# Patient Record
Sex: Female | Born: 1990 | Race: Black or African American | Hispanic: No | Marital: Single | Smoking: Current some day smoker
Health system: Southern US, Community
[De-identification: ages and names within clinical notes are randomized; demographics above are authoritative.]

## PROBLEM LIST (undated history)

## (undated) DIAGNOSIS — B019 Varicella without complication: Secondary | ICD-10-CM

## (undated) DIAGNOSIS — F1910 Other psychoactive substance abuse, uncomplicated: Secondary | ICD-10-CM

## (undated) DIAGNOSIS — N83209 Unspecified ovarian cyst, unspecified side: Secondary | ICD-10-CM

## (undated) DIAGNOSIS — Z202 Contact with and (suspected) exposure to infections with a predominantly sexual mode of transmission: Secondary | ICD-10-CM

## (undated) DIAGNOSIS — R599 Enlarged lymph nodes, unspecified: Secondary | ICD-10-CM

## (undated) HISTORY — PX: GASTROSTOMY W/ FEEDING TUBE: SUR642

## (undated) HISTORY — DX: Other psychoactive substance abuse, uncomplicated: F19.10

## (undated) HISTORY — DX: Enlarged lymph nodes, unspecified: R59.9

## (undated) HISTORY — DX: Varicella without complication: B01.9

## (undated) HISTORY — DX: Contact with and (suspected) exposure to infections with a predominantly sexual mode of transmission: Z20.2

---
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
## 2011-08-17 ENCOUNTER — Emergency Department (HOSPITAL_COMMUNITY)
Admission: EM | Admit: 2011-08-17 | Discharge: 2011-08-18 | Disposition: A | Discharge status: Home / Self Care | Payer: Self-pay | Attending: Emergency Medicine | Admitting: Emergency Medicine

## 2011-08-17 ENCOUNTER — Encounter (HOSPITAL_COMMUNITY): Payer: Self-pay | Admitting: *Deleted

## 2011-08-17 DIAGNOSIS — N730 Acute parametritis and pelvic cellulitis: Secondary | ICD-10-CM | POA: Insufficient documentation

## 2011-08-17 DIAGNOSIS — N760 Acute vaginitis: Secondary | ICD-10-CM | POA: Insufficient documentation

## 2011-08-17 DIAGNOSIS — B9689 Other specified bacterial agents as the cause of diseases classified elsewhere: Secondary | ICD-10-CM | POA: Insufficient documentation

## 2011-08-17 DIAGNOSIS — A499 Bacterial infection, unspecified: Secondary | ICD-10-CM | POA: Insufficient documentation

## 2011-08-17 DIAGNOSIS — R109 Unspecified abdominal pain: Secondary | ICD-10-CM | POA: Insufficient documentation

## 2011-08-17 LAB — DIFFERENTIAL
Lymphs Abs: 2.8 10*3/uL (ref 0.7–4.0)
Monocytes Relative: 9 % (ref 3–12)
Neutro Abs: 3.9 10*3/uL (ref 1.7–7.7)
Neutrophils Relative %: 52 % (ref 43–77)

## 2011-08-17 LAB — URINALYSIS, ROUTINE W REFLEX MICROSCOPIC
Bilirubin Urine: NEGATIVE
Hgb urine dipstick: NEGATIVE
Ketones, ur: NEGATIVE mg/dL
Protein, ur: NEGATIVE mg/dL
Urobilinogen, UA: 1 mg/dL (ref 0.0–1.0)

## 2011-08-17 LAB — WET PREP, GENITAL

## 2011-08-17 LAB — CBC
Hemoglobin: 12.2 g/dL (ref 12.0–15.0)
RBC: 4.23 MIL/uL (ref 3.87–5.11)
WBC: 7.5 10*3/uL (ref 4.0–10.5)

## 2011-08-17 LAB — URINE MICROSCOPIC-ADD ON

## 2011-08-17 MED ORDER — AZITHROMYCIN 1 G PO PACK
1.0000 g | PACK | Freq: Once | ORAL | Status: AC
  Administered 2011-08-17: 1 g via ORAL
  Filled 2011-08-17: qty 1

## 2011-08-17 MED ORDER — LIDOCAINE HCL (PF) 1 % IJ SOLN
INTRAMUSCULAR | Status: AC
  Administered 2011-08-18: 2 mL
  Filled 2011-08-17: qty 5

## 2011-08-17 MED ORDER — METRONIDAZOLE 500 MG PO TABS
500.0000 mg | ORAL_TABLET | Freq: Once | ORAL | Status: AC
  Administered 2011-08-17: 500 mg via ORAL
  Filled 2011-08-17: qty 1

## 2011-08-17 MED ORDER — CEFTRIAXONE SODIUM 250 MG IJ SOLR
250.0000 mg | Freq: Once | INTRAMUSCULAR | Status: AC
  Administered 2011-08-17: 250 mg via INTRAMUSCULAR
  Filled 2011-08-17: qty 250

## 2011-08-17 NOTE — ED Notes (Signed)
Patient complaining of lower abdominal pain that started three days ago; patient complaining of abnormal yellow-tinged vaginal discharge.  Patient describes pain as "sore"; rates pain 6/10 on the numerical pain scale; pain is mostly lower quadrant abdominal pain, which radiates down into her pelvis and near anal area.  Denies any urinary changes.  Last menstrual period April 15th.  Patient alert and oriented x4; PERRL present.  No respiratory or acute distress noted.  Upon arrival to room, patient changed into gown.  Will continue to monitor.

## 2011-08-17 NOTE — ED Notes (Signed)
Assisted EDP with pelvic exam; lab now at bedside.

## 2011-08-17 NOTE — ED Notes (Signed)
Pelvic cart set up at bedside  

## 2011-08-17 NOTE — ED Provider Notes (Signed)
History     CSN: [id number]  Arrival date & time 08/17/11  2138   First MD Initiated Contact with Patient 08/17/11 2318      Chief Complaint  Patient presents with  . Abdominal Pain    (Consider location/radiation/quality/duration/timing/severity/associated sxs/prior treatment) Patient is a 21 y.o. female presenting with abdominal pain. The history is provided by the patient.  Abdominal Pain The primary symptoms of the illness include abdominal pain.  She has been having suprapubic pain for the last 3 days. Today, she's also had pain in the right suprapubic area. Pain does radiate through to the back. There is moderate in intensity and she rates it at 6/10. Nothing makes the pain better nothing makes it worse. She has not taken anything to try and help it. She denies fever, chills, sweats and denies nausea, vomiting, diarrhea. There has been a yellowish vaginal discharge present. Last menstrual period was April 12. She would've expected to have had a menstrual period in mid May but did not. She's not using any contraception.  History reviewed. No pertinent past medical history.  History reviewed. No pertinent past surgical history.  No family history on file.  History  Substance Use Topics  . Smoking status: Never Smoker   . Smokeless tobacco: Not on file  . Alcohol Use: No    OB History    Grav Para Term Preterm Abortions TAB SAB Ect Mult Living                  Review of Systems  Gastrointestinal: Positive for abdominal pain.  All other systems reviewed and are negative.    Allergies  Latex  Home Medications  No current outpatient prescriptions on file.  BP 124/75  Pulse 93  Temp(Src) 98.2 F (36.8 C) (Oral)  Resp 19  SpO2 100%  LMP 07/18/2011  Physical Exam  Nursing note and vitals reviewed.  21 year old female who is resting comfortably and in no acute distress. Vital signs are normal. Oxygen saturation is 100% which is normal. Head is normocephalic  and atraumatic. PERRLA, EOMI. Oropharynx is clear. Neck is nontender and supple. Back is nontender there's no CVA tenderness. Lungs are clear without rales, wheezes, rhonchi. Heart is regular rate and rhythm without murmur. Abdomen is soft, flat, with moderate suprapubic tenderness but no rebound or guarding. Peristalsis is present. Pelvic: Normal external female genitalia. Cervix is appears moderately inflamed with a mild yellowish discharge present. Specimens were sent for cultures. On bimanual examination, the fundus is normal size and position. There is mild to moderate adnexal tenderness bilaterally which is worse on the right. No adnexal masses are present. There is mild to moderate cervical motion tenderness. Extremities have full range of motion, no cyanosis or edema. Skin is warm and dry without rash. Neurologic: Mental status is normal, cranial nerves are intact, there no focal motor or sensory deficits.  ED Course  Procedures (including critical care time)  Results for orders placed during the hospital encounter of 08/17/11  URINALYSIS, ROUTINE W REFLEX MICROSCOPIC      Component Value Range   Color, Urine YELLOW  YELLOW    APPearance CLOUDY (*) CLEAR    Specific Gravity, Urine 1.029  1.005 - 1.030    pH 6.0  5.0 - 8.0    Glucose, UA NEGATIVE  NEGATIVE (mg/dL)   Hgb urine dipstick NEGATIVE  NEGATIVE    Bilirubin Urine NEGATIVE  NEGATIVE    Ketones, ur NEGATIVE  NEGATIVE (mg/dL)   Protein, ur NEGATIVE  NEGATIVE (mg/dL)   Urobilinogen, UA 1.0  0.0 - 1.0 (mg/dL)   Nitrite NEGATIVE  NEGATIVE    Leukocytes, UA MODERATE (*) NEGATIVE   PREGNANCY, URINE      Component Value Range   Preg Test, Ur NEGATIVE  NEGATIVE   URINE MICROSCOPIC-ADD ON      Component Value Range   Squamous Epithelial / LPF MANY (*) RARE    WBC, UA 3-6  <3 (WBC/hpf)   RBC / HPF 0-2  <3 (RBC/hpf)   Bacteria, UA FEW (*) RARE    Urine-Other MUCOUS PRESENT    CBC      Component Value Range   WBC 7.5  4.0 - 10.5  (K/uL)   RBC 4.23  3.87 - 5.11 (MIL/uL)   Hemoglobin 12.2  12.0 - 15.0 (g/dL)   HCT 16.1 (*) 09.6 - 46.0 (%)   MCV 84.4  78.0 - 100.0 (fL)   MCH 28.8  26.0 - 34.0 (pg)   MCHC 34.2  30.0 - 36.0 (g/dL)   RDW 04.5  40.9 - 81.1 (%)   Platelets 257  150 - 400 (K/uL)  DIFFERENTIAL      Component Value Range   Neutrophils Relative 52  43 - 77 (%)   Neutro Abs 3.9  1.7 - 7.7 (K/uL)   Lymphocytes Relative 38  12 - 46 (%)   Lymphs Abs 2.8  0.7 - 4.0 (K/uL)   Monocytes Relative 9  3 - 12 (%)   Monocytes Absolute 0.7  0.1 - 1.0 (K/uL)   Eosinophils Relative 1  0 - 5 (%)   Eosinophils Absolute 0.1  0.0 - 0.7 (K/uL)   Basophils Relative 0  0 - 1 (%)   Basophils Absolute 0.0  0.0 - 0.1 (K/uL)  BASIC METABOLIC PANEL      Component Value Range   Sodium 136  135 - 145 (mEq/L)   Potassium 3.9  3.5 - 5.1 (mEq/L)   Chloride 101  96 - 112 (mEq/L)   CO2 27  19 - 32 (mEq/L)   Glucose, Bld 71  70 - 99 (mg/dL)   BUN 14  6 - 23 (mg/dL)   Creatinine, Ser 9.14  0.50 - 1.10 (mg/dL)   Calcium 9.1  8.4 - 78.2 (mg/dL)   GFR calc non Af Amer >90  >90 (mL/min)   GFR calc Af Amer >90  >90 (mL/min)  WET PREP, GENITAL      Component Value Range   Yeast Wet Prep HPF POC NONE SEEN  NONE SEEN    Trich, Wet Prep NONE SEEN  NONE SEEN    Clue Cells Wet Prep HPF POC MODERATE (*) NONE SEEN    WBC, Wet Prep HPF POC MANY (*) NONE SEEN      1. PID (acute pelvic inflammatory disease)   2. Bacterial vaginosis       MDM  Pelvic pain which most likely represents PID. Pregnancy test has come back negative. She will be treated empirically.  Wet prep showed evidence of bacterial vaginosis. She was given an injection of Rocephin and oral Zithromax in the emergency department. She's not able to afford doxycycline so alternative treatment for PID is 1000 mg of azithromycin once a week for 2 weeks. She is also placed on metronidazole to treat bacterial vaginosis. She is advised to use over-the-counter analgesics as needed  for pain.      Dione Booze, MD 08/18/11 [phone]

## 2011-08-17 NOTE — ED Notes (Signed)
abd pain for 3 days with a vaginal discharge.  lmp april

## 2011-08-18 LAB — BASIC METABOLIC PANEL
BUN: 14 mg/dL (ref 6–23)
Chloride: 101 mEq/L (ref 96–112)
GFR calc Af Amer: >90 mL/min (ref >90)
Glucose, Bld: 71 mg/dL (ref 70–99)
Potassium: 3.9 mEq/L (ref 3.5–5.1)

## 2011-08-18 LAB — HIV ANTIBODY (ROUTINE TESTING W REFLEX): HIV: NONREACTIVE

## 2011-08-18 LAB — RPR: RPR Ser Ql: NONREACTIVE

## 2011-08-18 MED ORDER — METRONIDAZOLE 500 MG PO TABS: 500.0000 mg | ORAL_TABLET | Freq: Two times a day (BID) | ORAL | Status: AC

## 2011-08-18 MED ORDER — AZITHROMYCIN 250 MG PO TABS: 1000.0000 mg | ORAL_TABLET | Freq: Once | ORAL | Status: AC

## 2011-08-18 NOTE — Discharge Instructions (Signed)
Take acetaminophen or ibuprofen as needed for pain.  Pelvic Inflammatory Disease Pelvic Inflammatory Disease (PID) is an infection in some or all of your female organs. This includes the womb (uterus), ovaries, fallopian tubes and tissues in the pelvis. PID is a common cause of sudden onset (acute) lower abdominal (pelvic) pain. PID can be treated, but it is a serious infection. It may take weeks before you are completely well. In some cases, hospitalization is needed for surgery or to administer medications to kill germs (antibiotics) through your veins (intravenously). CAUSES   It may be caused by germs that are spread during sexual contact.   PID can also occur following:   The birth of a baby.   A miscarriage.   An abortion.   Major surgery of the pelvis.   Use of an IUD.   Sexual assault.  SYMPTOMS   Abdominal or pelvic pain.   Fever.   Chills.   Abnormal vaginal discharge.  DIAGNOSIS  Your caregiver will choose some of these methods to make a diagnosis:  A physical exam and history.   Blood tests.   Cultures of the vagina and cervix.   X-rays or ultrasound.   A procedure to look inside the pelvis (laparoscopy).  TREATMENT   Use of antibiotics by mouth or intravenously.   Treatment of sexual partners when the infection is an sexually transmitted disease (STD).   Hospitalization and surgery may be needed.  RISKS AND COMPLICATIONS   PID can cause women to become unable to have children (sterile) if left untreated or if partially treated. That is why it is important to finish all medications given to you.   Sterility or future tubal (ectopic) pregnancies can occur in fully treated individuals. This is why it is so important to follow your prescribed treatment.   It can cause longstanding (chronic) pelvic pain after frequent infections.   Painful intercourse.   Pelvic abscesses.   In rare cases, surgery or a hysterectomy may be needed.   If this is a  sexually transmitted infection (STI), you are also at risk for any other STD including AIDSor human papillomavirus (HPV).  HOME CARE INSTRUCTIONS   Finish all medication as prescribed. Incomplete treatment will put you at risk for sterility and tubal pregnancy.   Only take over-the-counter or prescription medicines for pain, discomfort, or fever as directed by your caregiver.   Do not have sex until treatment is completed or as directed by your caregiver. If PID is confirmed, your recent sexual contacts will need treatment.   Keep your follow-up appointments.  SEEK MEDICAL CARE IF:   You have increased or abnormal vaginal discharge.   You need prescription medication for your pain.   Your partner has an STD.   You are vomiting.   You cannot take your medications.  SEEK IMMEDIATE MEDICAL CARE IF:   You have a fever.   You develop increased abdominal or pelvic pain.   You develop chills.   You have pain when you urinate.   You are not better after 72 hours following treatment.  Document Released: 03/10/2005 Document Revised: 02/27/2011 Document Reviewed: 11/21/2006 [facility] Patient Information 2012 [place], [place].  Azithromycin tablets What is this medicine? AZITHROMYCIN (az ith roe MYE sin) is a macrolide antibiotic. It is used to treat or prevent certain kinds of bacterial infections. It will not work for colds, flu, or other viral infections. This medicine may be used for other purposes; ask your health care provider or pharmacist if  you have questions. What should I tell my health care provider before I take this medicine? They need to know if you have any of these conditions: -kidney disease -liver disease -irregular heartbeat or heart disease -an unusual or allergic reaction to azithromycin, erythromycin, other macrolide antibiotics, foods, dyes, or preservatives -pregnant or trying to get pregnant -breast-feeding How should I use this medicine? Take this medicine  by mouth with a full glass of water. Follow the directions on the prescription label. The tablets can be taken with food or on an empty stomach. If the medicine upsets your stomach, take it with food. Take your medicine at regular intervals. Do not take your medicine more often than directed. Take all of your medicine as directed even if you think your are better. Do not skip doses or stop your medicine early. Talk to your pediatrician regarding the use of this medicine in children. Special care may be needed. Overdosage: If you think you have taken too much of this medicine contact a poison control center or emergency room at once. NOTE: This medicine is only for you. Do not share this medicine with others. What if I miss a dose? If you miss a dose, take it as soon as you can. If it is almost time for your next dose, take only that dose. Do not take double or extra doses. What may interact with this medicine? Do not take this medicine with any of the following medications: -lincomycin This medicine may also interact with the following medications: -amiodarone -antacids -cyclosporine -digoxin -dihydroergotamine or ergotamine -magnesium -nelfinavir -phenytoin -warfarin This list may not describe all possible interactions. Give your health care provider a list of all the medicines, herbs, non-prescription drugs, or dietary supplements you use. Also tell them if you smoke, drink alcohol, or use illegal drugs. Some items may interact with your medicine. What should I watch for while using this medicine? Tell your doctor or health care professional if your symptoms do not improve. Do not treat diarrhea with over the counter products. Contact your doctor if you have diarrhea that lasts more than 2 days or if it is severe and watery. This medicine can make you more sensitive to the sun. Keep out of the sun. If you cannot avoid being in the sun, wear protective clothing and use sunscreen. Do not use  sun lamps or tanning beds/booths. What side effects may I notice from receiving this medicine? Side effects that you should report to your doctor or health care professional as soon as possible: -allergic reactions like skin rash, itching or hives, swelling of the face, lips, or tongue -confusion, nightmares or hallucinations -dark urine -difficulty breathing -hearing loss -irregular heartbeat or chest pain -pain or difficulty passing urine -redness, blistering, peeling or loosening of the skin, including inside the mouth -white patches or sores in the mouth -yellowing of the eyes or skin Side effects that usually do not require medical attention (report to your doctor or health care professional if they continue or are bothersome): -diarrhea -dizziness, drowsiness -headache -stomach upset or vomiting -tooth discoloration -vaginal irritation This list may not describe all possible side effects. Call your doctor for medical advice about side effects. You may report side effects to FDA at 1-800-FDA-1088. Where should I keep my medicine? Keep out of the reach of children. Store at room temperature between 15 and 30 degrees C (59 and 86 degrees F). Throw away any unused medicine after the expiration date. NOTE: This sheet is a summary. It  may not cover all possible information. If you have questions about this medicine, talk to your doctor, pharmacist, or health care provider.  2012, [employer]vier/[employer] Standard. (06/01/2007 1:50:13 PM)  Metronidazole tablets or capsules What is this medicine? METRONIDAZOLE (me troe NI da zole) is an antiinfective. It is used to treat certain kinds of bacterial and protozoal infections. It will not work for colds, flu, or other viral infections. This medicine may be used for other purposes; ask your health care provider or pharmacist if you have questions. What should I tell my health care provider before I take this medicine? They need to know if you have any  of these conditions: -anemia or other blood disorders -disease of the nervous system -fungal or yeast infection -if you drink alcohol containing drinks -liver disease -seizures -an unusual or allergic reaction to metronidazole, or other medicines, foods, dyes, or preservatives -pregnant or trying to get pregnant -breast-feeding How should I use this medicine? Take this medicine by mouth with a full glass of water. Follow the directions on the prescription label. Take your medicine at regular intervals. Do not take your medicine more often than directed. Take all of your medicine as directed even if you think you are better. Do not skip doses or stop your medicine early. Talk to your pediatrician regarding the use of this medicine in children. Special care may be needed. Overdosage: If you think you have taken too much of this medicine contact a poison control center or emergency room at once. NOTE: This medicine is only for you. Do not share this medicine with others. What if I miss a dose? If you miss a dose, take it as soon as you can. If it is almost time for your next dose, take only that dose. Do not take double or extra doses. What may interact with this medicine? Do not take this medicine with any of the following medications: -alcohol or any product that contains alcohol -amprenavir oral solution -disulfiram -paclitaxel injection -ritonavir oral solution -sertraline oral solution -sulfamethoxazole-trimethoprim injection This medicine may also interact with the following medications: -cimetidine -lithium -phenobarbital -phenytoin -warfarin This list may not describe all possible interactions. Give your health care provider a list of all the medicines, herbs, non-prescription drugs, or dietary supplements you use. Also tell them if you smoke, drink alcohol, or use illegal drugs. Some items may interact with your medicine. What should I watch for while using this medicine? Tell  your doctor or health care professional if your symptoms do not improve or if they get worse. You may get drowsy or dizzy. Do not drive, use machinery, or do anything that needs mental alertness until you know how this medicine affects you. Do not stand or sit up quickly, especially if you are an older patient. This reduces the risk of dizzy or fainting spells. Avoid alcoholic drinks while you are taking this medicine and for three days afterward. Alcohol may make you feel dizzy, sick, or flushed. If you are being treated for a sexually transmitted disease, avoid sexual contact until you have finished your treatment. Your sexual partner may also need treatment. What side effects may I notice from receiving this medicine? Side effects that you should report to your doctor or health care professional as soon as possible: -allergic reactions like skin rash or hives, swelling of the face, lips, or tongue -confusion, clumsiness -difficulty speaking -discolored or sore mouth -dizziness -fever, infection -numbness, tingling, pain or weakness in the hands or feet -trouble passing urine  or change in the amount of urine -redness, blistering, peeling or loosening of the skin, including inside the mouth -seizures -unusually weak or tired -vaginal irritation, dryness, or discharge Side effects that usually do not require medical attention (report to your doctor or health care professional if they continue or are bothersome): -diarrhea -headache -irritability -metallic taste -nausea -stomach pain or cramps -trouble sleeping This list may not describe all possible side effects. Call your doctor for medical advice about side effects. You may report side effects to FDA at 1-800-FDA-1088. Where should I keep my medicine? Keep out of the reach of children. Store at room temperature below 25 degrees C (77 degrees F). Protect from light. Keep container tightly closed. Throw away any unused medicine after the  expiration date. NOTE: This sheet is a summary. It may not cover all possible information. If you have questions about this medicine, talk to your doctor, pharmacist, or health care provider.  2012, [employer]vier/Gold Standard. (12/27/2007 10:37:11 AM)  Safer Sex Your caregiver wants you to have this information about the infections that can be transmitted from sexual contact and how to prevent them. The idea behind safer sex is that you can be sexually active, and at the same time reduce the risk of giving or getting a sexually transmitted disease (STD). Every person should be aware of how to prevent him or herself and his or her sex partner from getting an STD. CAUSES OF STDS STDs are transmitted by sharing body fluids, which contain viruses and bacteria. The following fluids all transmit infections during sexual intercourse and sex acts:  Semen.   Saliva.   Urine.   Blood.   Vaginal mucus.  Examples of STDs include:  Chlamydia.   Gonorrhea.   Genital herpes.   Hepatitis B.   Human immunodeficiency virus or acquired immunodeficiency syndrome (HIV or AIDS).   Syphilis.   Trichomonas.   Pubic lice.   Human papillomavirus (HPV), which may include:   Genital warts.   Cervical dysplasia.   Cervical cancer (can develop with certain types of HPV).  SYMPTOMS  Sexual diseases often cause few or no symptoms until they are advanced, so a person can be infected and spread the infection without knowing it. Some STDs respond to treatment very well. Others, like HIV and herpes, cannot be cured, but are treated to reduce their effects. Specific symptoms include:  Abnormal vaginal discharge.   Irritation or itching in and around the vagina, and in the pubic hair.   Pain during sexual intercourse.   Bleeding during sexual intercourse.   Pelvic or abdominal pain.   Fever.   Growths in and around the vagina.   An ulcer in or around the vagina.   Swollen glands in the groin  area.  DIAGNOSIS   Blood tests.   Pap test.   Culture test of abnormal vaginal discharge.   A test that applies a solution and examines the cervix with a lighted magnifying scope (colposcopy).   A test that examines the pelvis with a lighted tube, through a small incision (laparoscopy).  TREATMENT  The treatment will depend on the cause of the STD.  Antibiotic treatment by injection, oral, creams, or suppositories in the vagina.   Over-the-counter medicated shampoo, to get rid of pubic lice.   Removing or treating growths with medicine, freezing, burning (electrocautery), or surgery.   Surgery treatment for HPV of the cervix.   Supportive medicines for herpes, HIV, AIDS, and hepatitis.  Being careful cannot eliminate all  risk of infection, but sex can be made much safer. Safe sexual practices include body massage and gentle touching. Masturbation is safe, as long as body fluids do not contact skin that has sores or cuts. Dry kissing and oral sex on a man wearing a latex condom or on a woman wearing a female condom is also safe. Slightly less safe is intercourse while the man wears a latex condom or wet kissing. It is also safer to have one sex partner that you know is not having sex with anyone else. LENGTH OF ILLNESS An STD might be treated and cured in a week, sometimes a month, or more. And it can linger with symptoms for many years. STDs can also cause damage to the female organs. This can cause chronic pain, infertility, and recurrence of the STD, especially herpes, hepatitis, HIV, and HPV. HOME CARE INSTRUCTIONS AND PREVENTION  Alcohol and recreational drugs are often the reason given for not practicing safer sex. These substances affect your judgment. Alcohol and recreational drugs can also impair your immune system, making you more vulnerable to disease.   Do not engage in risky and dangerous sexual practices, including:   Vaginal or anal sex without a condom.   Oral sex  on a man without a condom.   Oral sex on a woman without a female condom.   Using saliva to lubricate a condom.   Any other sexual contact in which body fluids or blood from one partner contact the other partner.   You should use only latex condoms for men and water soluble lubricants. Petroleum based lubricants or oils used to lubricate a condom will weaken the condom and increase the chance that it will break.   Think very carefully before having sex with anyone who is high risk for STDs and HIV. This includes IV drug users, people with multiple sexual partners, or people who have had an STD, or a positive hepatitis or HIV blood test.   Remember that even if your partner has had only one previous partner, their previous partner might have had multiple partners. If so, you are at high risk of being exposed to an STD. You and your sex partner should be the only sex partners with each other, with no one else involved.   A vaccine is available for hepatitis B and HPV through your caregiver or the Public Health Department. Everyone should be vaccinated with these vaccines.   Avoid risky sex practices. Sex acts that can break the skin make you more likely to get an STD.  SEEK MEDICAL CARE IF:   If you think you have an STD, even if you do not have any symptoms. Contact your caregiver for evaluation and treatment, if needed.   You think or know your sex partner has acquired an STD.   You have any of the symptoms mentioned above.  Document Released: 04/17/2004 Document Revised: 02/27/2011 Document Reviewed: 02/07/2009 [facility] Patient Information 2012 [place], [place].  Bacterial Vaginosis Bacterial vaginosis (BV) is a vaginal infection where the normal balance of bacteria in the vagina is disrupted. The normal balance is then replaced by an overgrowth of certain bacteria. There are several different kinds of bacteria that can cause BV. BV is the most common vaginal infection in women of  childbearing age. CAUSES   The cause of BV is not fully understood. BV develops when there is an increase or imbalance of harmful bacteria.   Some activities or behaviors can upset the normal balance of bacteria  in the vagina and put women at increased risk including:   Having a new sex partner or multiple sex partners.   Douching.   Using an intrauterine device (IUD) for contraception.   It is not clear what role sexual activity plays in the development of BV. However, women that have never had sexual intercourse are rarely infected with BV.  Women do not get BV from toilet seats, bedding, swimming pools or from touching objects around them.  SYMPTOMS   Grey vaginal discharge.   A fish-like odor with discharge, especially after sexual intercourse.   Itching or burning of the vagina and vulva.   Burning or pain with urination.   Some women have no signs or symptoms at all.  DIAGNOSIS  Your caregiver must examine the vagina for signs of BV. Your caregiver will perform lab tests and look at the sample of vaginal fluid through a microscope. They will look for bacteria and abnormal cells (clue cells), a pH test higher than 4.5, and a positive amine test all associated with BV.  RISKS AND COMPLICATIONS   Pelvic inflammatory disease (PID).   Infections following gynecology surgery.   Developing HIV.   Developing herpes virus.  TREATMENT  Sometimes BV will clear up without treatment. However, all women with symptoms of BV should be treated to avoid complications, especially if gynecology surgery is planned. Female partners generally do not need to be treated. However, BV may spread between female sex partners so treatment is helpful in preventing a recurrence of BV.   BV may be treated with antibiotics. The antibiotics come in either pill or vaginal cream forms. Either can be used with nonpregnant or pregnant women, but the recommended dosages differ. These antibiotics are not harmful  to the baby.   BV can recur after treatment. If this happens, a second round of antibiotics will often be prescribed.   Treatment is important for pregnant women. If not treated, BV can cause a premature delivery, especially for a pregnant woman who had a premature birth in the past. All pregnant women who have symptoms of BV should be checked and treated.   For chronic reoccurrence of BV, treatment with a type of prescribed gel vaginally twice a week is helpful.  HOME CARE INSTRUCTIONS   Finish all medication as directed by your caregiver.   Do not have sex until treatment is completed.   Tell your sexual partner that you have a vaginal infection. They should see their caregiver and be treated if they have problems, such as a mild rash or itching.   Practice safe sex. Use condoms. Only have 1 sex partner.  PREVENTION  Basic prevention steps can help reduce the risk of upsetting the natural balance of bacteria in the vagina and developing BV:  Do not have sexual intercourse (be abstinent).   Do not douche.   Use all of the medicine prescribed for treatment of BV, even if the signs and symptoms go away.   Tell your sex partner if you have BV. That way, they can be treated, if needed, to prevent reoccurrence.  SEEK MEDICAL CARE IF:   Your symptoms are not improving after 3 days of treatment.   You have increased discharge, pain, or fever.  MAKE SURE YOU:   Understand these instructions.   Will watch your condition.   Will get help right away if you are not doing well or get worse.  FOR MORE INFORMATION  Division of STD Prevention (DSTDP), Centers for Disease  Control and Prevention: [employer] American Social Health Association (ASHA): www.ashastd.org  Document Released: 03/10/2005 Document Revised: 02/27/2011 Document Reviewed: 08/31/2008 [facility] Patient Information 2012 [place], [place].

## 2011-08-18 NOTE — ED Notes (Signed)
Patient currently resting quietly in bed; no respiratory or acute distress noted.  Patient updated on plan of care; informed patient that we are currently waiting on further orders from EDP.  Patient has no other questions or concerns at this time; will continue to monitor. 

## 2011-08-18 NOTE — ED Notes (Signed)
Dr. Glick at bedside.  

## 2011-08-18 NOTE — ED Notes (Signed)
Patient given discharge paperwork; went over discharge instructions with patient.  Patient instructed to finish taking antibiotic prescriptions completely, to follow up with her primary care physician, to take Tylenol or ibuprofen for pain, and to return to the ED for new, worsening, or concerning symptoms.

## 2011-08-18 NOTE — ED Notes (Signed)
Patient currently resting quietly in bed; no respiratory or acute distress noted. Patient updated on plan of care; informed patient that we are currently waiting on further orders/disposition from EDP.  Patient has no other questions or concerns at this time; will continue to monitor. 

## 2011-08-20 LAB — GC/CHLAMYDIA PROBE AMP, GENITAL: Chlamydia, DNA Probe: POSITIVE — AB

## 2011-08-21 NOTE — ED Notes (Signed)
Called and notified patient of + result and treatment. Advised to abstain from sex for 2 weeks and notify partner(s) to get tested. 

## 2011-08-21 NOTE — ED Notes (Signed)
+  Chlamydia. Patient treated with Rocephin and Zithromax. Sensitive to same. Per protocol MD.

## 2012-01-24 ENCOUNTER — Encounter (HOSPITAL_COMMUNITY): Payer: Self-pay | Admitting: Emergency Medicine

## 2012-01-24 ENCOUNTER — Emergency Department (HOSPITAL_COMMUNITY)
Admission: EM | Admit: 2012-01-24 | Discharge: 2012-01-25 | Disposition: A | Discharge status: Home / Self Care | Payer: Medicaid Other | Attending: Emergency Medicine | Admitting: Emergency Medicine

## 2012-01-24 ENCOUNTER — Emergency Department (HOSPITAL_COMMUNITY): Payer: Medicaid Other

## 2012-01-24 DIAGNOSIS — R109 Unspecified abdominal pain: Secondary | ICD-10-CM

## 2012-01-24 DIAGNOSIS — R1031 Right lower quadrant pain: Secondary | ICD-10-CM | POA: Insufficient documentation

## 2012-01-24 DIAGNOSIS — F17200 Nicotine dependence, unspecified, uncomplicated: Secondary | ICD-10-CM | POA: Insufficient documentation

## 2012-01-24 DIAGNOSIS — N83209 Unspecified ovarian cyst, unspecified side: Secondary | ICD-10-CM | POA: Insufficient documentation

## 2012-01-24 LAB — CBC
HCT: 33.4 % — ABNORMAL LOW (ref 36.0–46.0)
Hemoglobin: 11.7 g/dL — ABNORMAL LOW (ref 12.0–15.0)
MCH: 29.3 pg (ref 26.0–34.0)
MCHC: 35 g/dL (ref 30.0–36.0)
MCV: 83.7 fL (ref 78.0–100.0)
Platelets: 310 10*3/uL (ref 150–400)
RBC: 3.99 MIL/uL (ref 3.87–5.11)
RDW: 13.3 % (ref 11.5–15.5)
WBC: 11.7 10*3/uL — ABNORMAL HIGH (ref 4.0–10.5)

## 2012-01-24 LAB — COMPREHENSIVE METABOLIC PANEL
ALT: 8 U/L (ref 0–35)
AST: 22 U/L (ref 0–37)
Albumin: 4 g/dL (ref 3.5–5.2)
Alkaline Phosphatase: 57 U/L (ref 39–117)
BUN: 9 mg/dL (ref 6–23)
CO2: 24 mEq/L (ref 19–32)
Calcium: 9.1 mg/dL (ref 8.4–10.5)
Chloride: 101 mEq/L (ref 96–112)
Creatinine, Ser: 0.71 mg/dL (ref 0.50–1.10)
GFR calc Af Amer: >90 mL/min (ref >90)
GFR calc non Af Amer: >90 mL/min (ref >90)
Glucose, Bld: 69 mg/dL — ABNORMAL LOW (ref 70–99)
Potassium: 3.8 mEq/L (ref 3.5–5.1)
Sodium: 137 mEq/L (ref 135–145)
Total Bilirubin: 0.4 mg/dL (ref 0.3–1.2)
Total Protein: 7.5 g/dL (ref 6.0–8.3)

## 2012-01-24 LAB — URINALYSIS, ROUTINE W REFLEX MICROSCOPIC
Bilirubin Urine: NEGATIVE
Glucose, UA: NEGATIVE mg/dL
Hgb urine dipstick: NEGATIVE
Ketones, ur: >80 mg/dL — AB
Nitrite: NEGATIVE
Protein, ur: NEGATIVE mg/dL
Specific Gravity, Urine: 1.03 (ref 1.005–1.030)
Urobilinogen, UA: 1 mg/dL (ref 0.0–1.0)
pH: 6 (ref 5.0–8.0)

## 2012-01-24 LAB — URINE MICROSCOPIC-ADD ON

## 2012-01-24 LAB — PREGNANCY, URINE: Preg Test, Ur: NEGATIVE

## 2012-01-24 LAB — LIPASE, BLOOD: Lipase: 19 U/L (ref 11–59)

## 2012-01-24 MED ORDER — MORPHINE SULFATE 4 MG/ML IJ SOLN
6.0000 mg | Freq: Once | INTRAMUSCULAR | Status: AC
  Administered 2012-01-24: 6 mg via INTRAVENOUS
  Filled 2012-01-24: qty 2

## 2012-01-24 MED ORDER — ONDANSETRON HCL 4 MG/2ML IJ SOLN
4.0000 mg | Freq: Once | INTRAMUSCULAR | Status: AC
  Administered 2012-01-24: 4 mg via INTRAVENOUS
  Filled 2012-01-24: qty 2

## 2012-01-24 MED ORDER — SODIUM CHLORIDE 0.9 % IV SOLN: Freq: Once | INTRAVENOUS | Status: DC

## 2012-01-24 MED ORDER — SODIUM CHLORIDE 0.9 % IV BOLUS (SEPSIS)
1000.0000 mL | Freq: Once | INTRAVENOUS | Status: AC
  Administered 2012-01-24: 1000 mL via INTRAVENOUS

## 2012-01-24 MED ORDER — IOHEXOL 300 MG/ML  SOLN
100.0000 mL | Freq: Once | INTRAMUSCULAR | Status: AC | PRN
  Administered 2012-01-24: 100 mL via INTRAVENOUS

## 2012-01-24 MED ORDER — KETOROLAC TROMETHAMINE 30 MG/ML IJ SOLN
15.0000 mg | Freq: Once | INTRAMUSCULAR | Status: AC
  Administered 2012-01-24: 15 mg via INTRAVENOUS
  Filled 2012-01-24: qty 1

## 2012-01-24 MED ORDER — OXYCODONE-ACETAMINOPHEN 5-325 MG PO TABS: 1.0000 | ORAL_TABLET | ORAL | Status: DC | PRN

## 2012-01-24 MED ORDER — OXYCODONE-ACETAMINOPHEN 5-325 MG PO TABS
1.0000 | ORAL_TABLET | Freq: Once | ORAL | Status: AC
  Administered 2012-01-24: 1 via ORAL
  Filled 2012-01-24: qty 1

## 2012-01-24 NOTE — ED Provider Notes (Signed)
History    21yF with abdominal pain. RUQ. "Underneath my right rib." Onset about a week ago and progressively worsening. Pain is worse with deep inspiration and walking. No hx of similar symptoms. No fever or chills. No SOB. No cough. No unusual leg pain or swelling. Nausea, but not vomiting. No urinary complaints. No vaginal bleeding or discharge. Is sexually active.    CSN: [id number]  Arrival date & time 01/24/12  1478   First MD Initiated Contact with Patient 01/24/12 1924      Chief Complaint  Patient presents with  . Pain Under Ribs     (Consider location/radiation/quality/duration/timing/severity/associated sxs/prior treatment) HPI  No past medical history on file.  Past Surgical History  Procedure Date  . Hernia repair     No family history on file.  History  Substance Use Topics  . Smoking status: Current Some Day Smoker  . Smokeless tobacco: Not on file  . Alcohol Use: Yes    OB History    Grav Para Term Preterm Abortions TAB SAB Ect Mult Living                  Review of Systems   Review of symptoms negative unless otherwise noted in HPI.   Allergies  Latex  Home Medications   Current Outpatient Rx  Name Route Sig Dispense Refill  . SIMETHICONE 80 MG PO CHEW Oral Chew 80 mg by mouth every 6 (six) hours as needed. bloathing      BP 127/80  Pulse 99  Temp 98.2 F (36.8 C) (Oral)  Resp 18  SpO2 100%  LMP 01/10/2012  Physical Exam  Nursing note and vitals reviewed. Constitutional: She appears well-developed and well-nourished. No distress.       Laying in bed. NAD.   HENT:  Head: Normocephalic and atraumatic.  Eyes: Conjunctivae normal are normal. Right eye exhibits no discharge. Left eye exhibits no discharge.  Neck: Neck supple.  Cardiovascular: Normal rate, regular rhythm and normal heart sounds.  Exam reveals no gallop and no friction rub.   No murmur heard. Pulmonary/Chest: Effort normal and breath sounds normal. No respiratory  distress.  Abdominal: Soft. She exhibits no distension and no mass. There is tenderness. There is guarding. There is no rebound.       Pt with r sided abdominal tenderness with voluntary guarding. No rebound. No mass.  Genitourinary:       No cva tenderness  Musculoskeletal: She exhibits no edema and no tenderness.       Lower extremities symmetric as compared to each other. No calf tenderness. Negative Homan's. No palpable cords.   Neurological: She is alert.  Skin: Skin is warm and dry. She is not diaphoretic.  Psychiatric: She has a normal mood and affect. Her behavior is normal. Thought content normal.    ED Course  Procedures (including critical care time)  Labs Reviewed  COMPREHENSIVE METABOLIC PANEL - Abnormal; Notable for the following:    Glucose, Bld 69 (*)     All other components within normal limits  CBC - Abnormal; Notable for the following:    WBC 11.7 (*)     Hemoglobin 11.7 (*)     HCT 33.4 (*)     All other components within normal limits  URINALYSIS, ROUTINE W REFLEX MICROSCOPIC - Abnormal; Notable for the following:    APPearance CLOUDY (*)     Ketones, ur >80 (*)     Leukocytes, UA SMALL (*)     All  other components within normal limits  URINE MICROSCOPIC-ADD ON - Abnormal; Notable for the following:    Squamous Epithelial / LPF FEW (*)     Bacteria, UA FEW (*)     All other components within normal limits  LIPASE, BLOOD  PREGNANCY, URINE  URINE CULTURE   Dg Chest 2 View  01/24/2012  *RADIOLOGY REPORT*  Clinical Data: Right chest pain, rib pain.  CHEST - 2 VIEW  Comparison: None  Findings: Heart and mediastinal contours are within normal limits. No focal opacities or effusions.  No acute bony abnormality.  No visible rib fracture.  No pneumothorax.  IMPRESSION: No active cardiopulmonary disease.   Original Report Authenticated By: Charlett Nose, M.D.    US Abdomen Complete  01/24/2012  *RADIOLOGY REPORT*  Clinical Data:  Right upper quadrant pain.   COMPLETE ABDOMINAL ULTRASOUND  Comparison:  None.  Findings:  Gallbladder:  No gallstones, gallbladder wall thickening, or pericholecystic fluid.  Common bile duct:   Within normal limits in caliber.  Liver:  No focal lesion identified.  Within normal limits in parenchymal echogenicity.  IVC:  Appears normal.  Pancreas:  No focal abnormality seen.  Spleen:  Within normal limits in size and echotexture.  Right Kidney:   Normal in size and parenchymal echogenicity.  No evidence of mass or hydronephrosis.  Left Kidney:  Normal in size and parenchymal echogenicity.  No evidence of mass or hydronephrosis.  Abdominal aorta:  No aneurysm identified.  IMPRESSION: Negative abdominal ultrasound.   Original Report Authenticated By: Charlett Nose, M.D.      1. Abdominal pain       MDM  21yF with abdominal pain. Etiology unclear, but does not appear to be emergent. Pt refusing pelvic exam though. Discussed than cannot adequately assess for potentially life threatening infection, infertility, etc from PID or TOA without doing pelvic. Although my suspicion is low I detailed risks. Pt still declining. Pt with ovarian cysts on CT and may be reason for symptoms, although could very well be incidental given size. Cysts of these size should not cause torsion. [place] without evidence of biliary disease. Strict return precautions dicussed. PRN pain meds. Outpt FU.         Raeford Razor, MD 01/24/12 [phone]

## 2012-01-24 NOTE — ED Notes (Signed)
Patient transported to CT 

## 2012-01-24 NOTE — ED Notes (Signed)
Pt states that she has "chronic rib pain".  When asked how long it has been going on, pt states "a week and a half.  Today it just became chronic". "It hurts to take a deep breath or to walk."  Pain is under right ribs.  Area is tender to touch.

## 2012-01-24 NOTE — ED Notes (Signed)
Discharge instructions reviewed. Rx given x1. All questions answered. 

## 2012-01-24 NOTE — ED Notes (Signed)
RN to obtain labs with start of IV 

## 2012-01-24 NOTE — ED Notes (Signed)
EKG printed and given to EDP Kohut for review. No previous available for comparison

## 2012-01-24 NOTE — ED Notes (Signed)
Patient transported to X-ray 

## 2012-01-26 LAB — URINE CULTURE
Colony Count: NO GROWTH
Culture: NO GROWTH

## 2013-03-14 IMAGING — US US ABDOMEN COMPLETE
1 series · 14 of 25 positions shown · non-contrast
Comparison: None.

CLINICAL DATA: Right upper quadrant pain.

COMPLETE ABDOMINAL ULTRASOUND

[Series 1: us abdomen complete · 0.18mm/px · 14 of 85 slices shown]
[im 1/85]
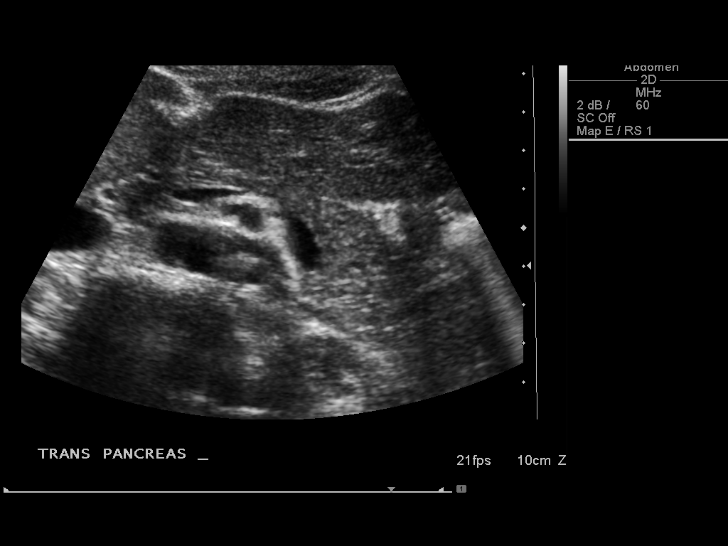
[im 8/85]
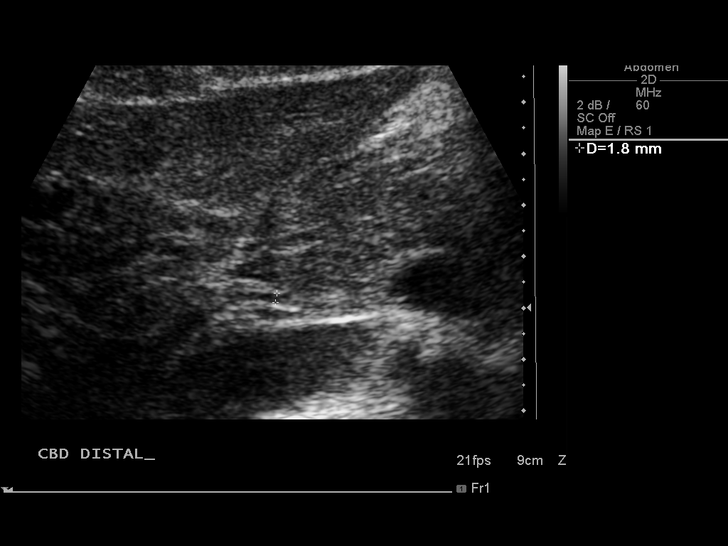
[im 15/85]
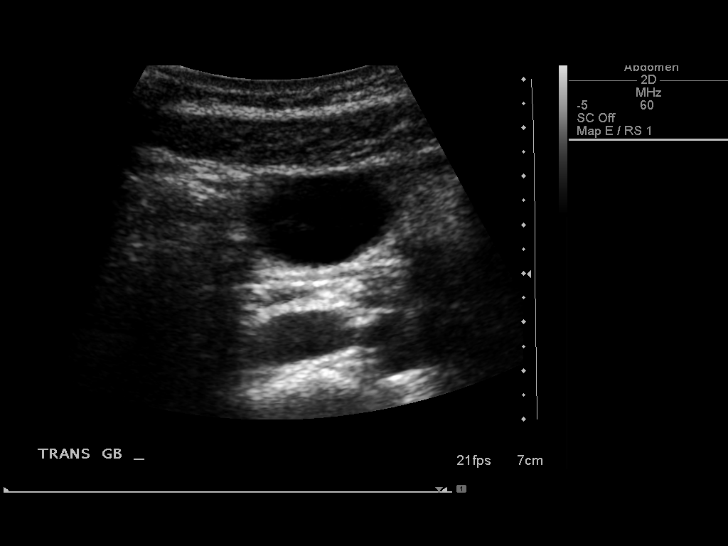
[im 22/85]
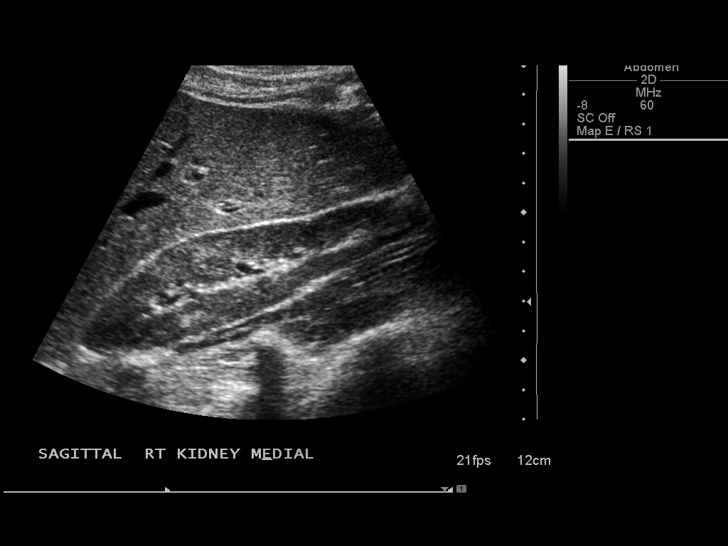
[im 29/85]
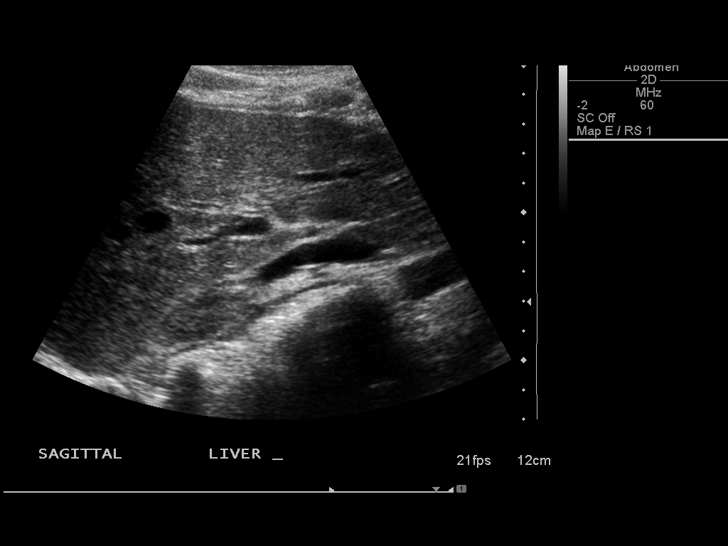
[im 32/85]
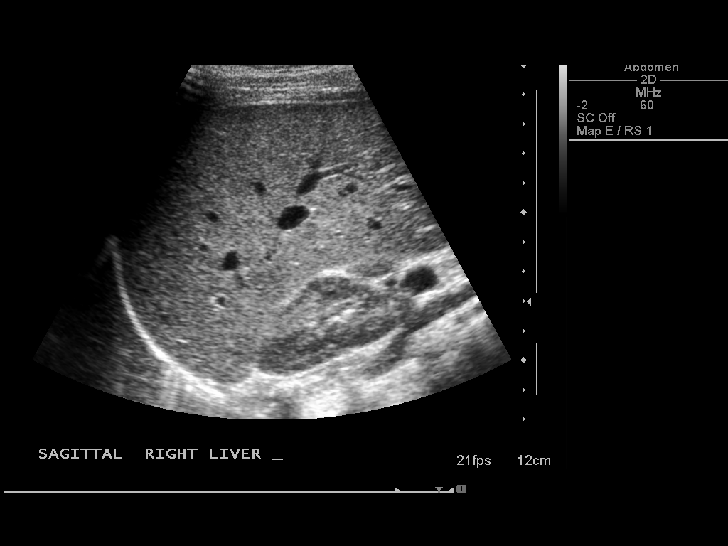
[im 39/85]
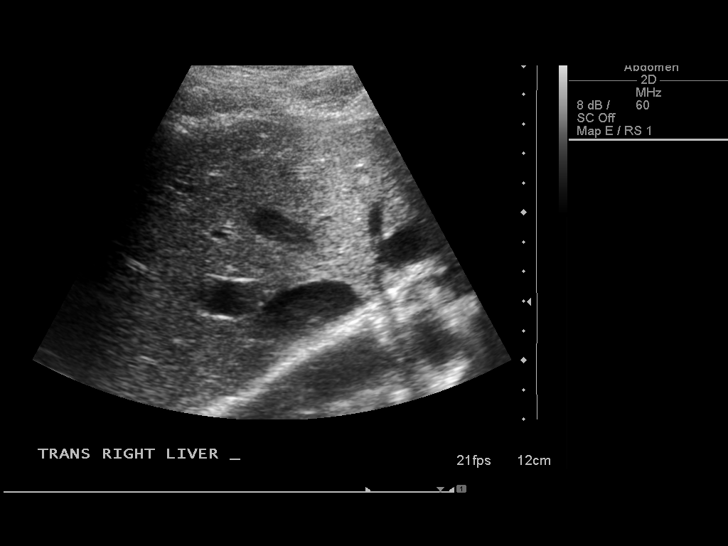
[im 46/85]
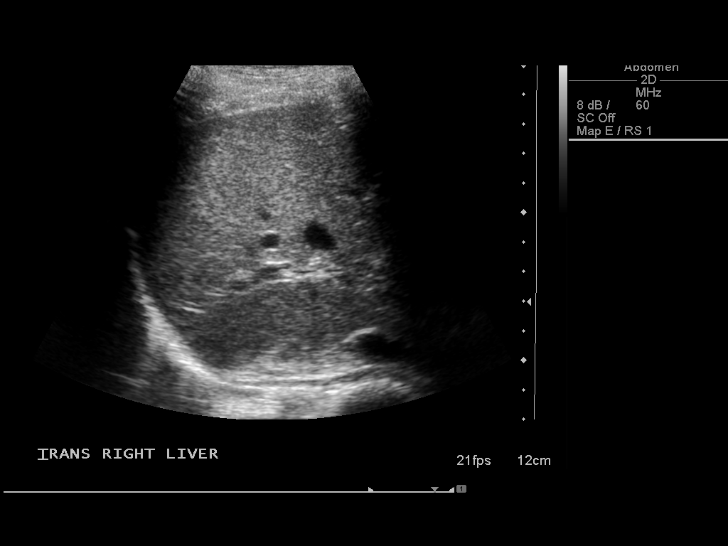
[im 53/85]
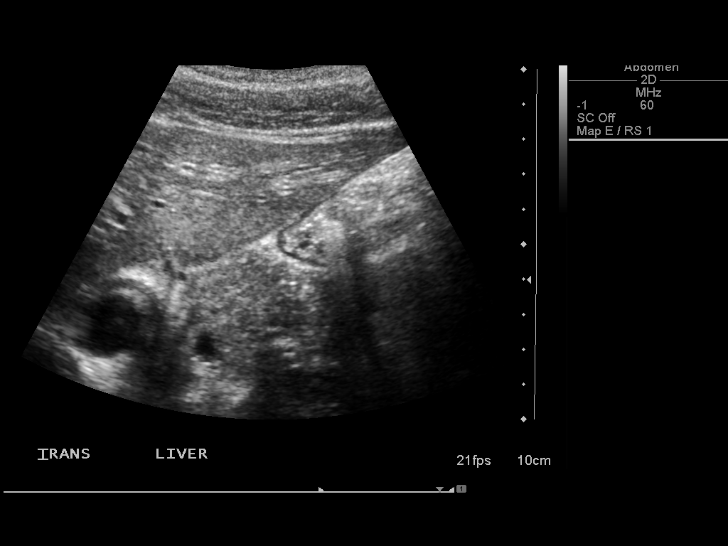
[im 57/85]
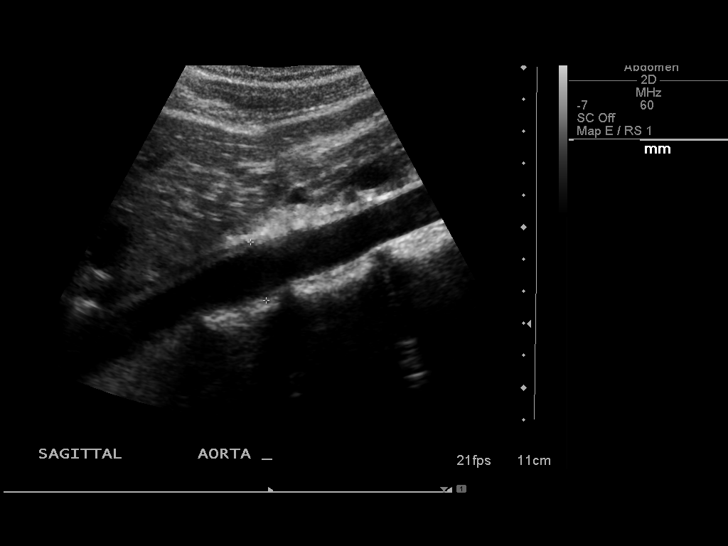
[im 64/85]
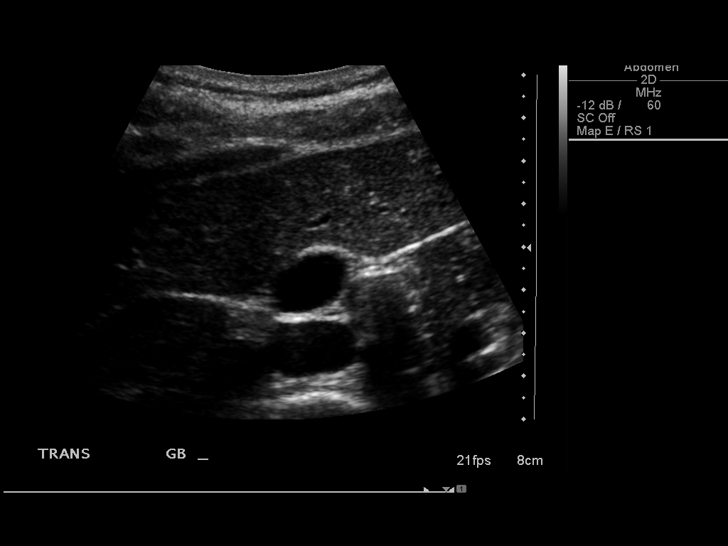
[im 71/85]
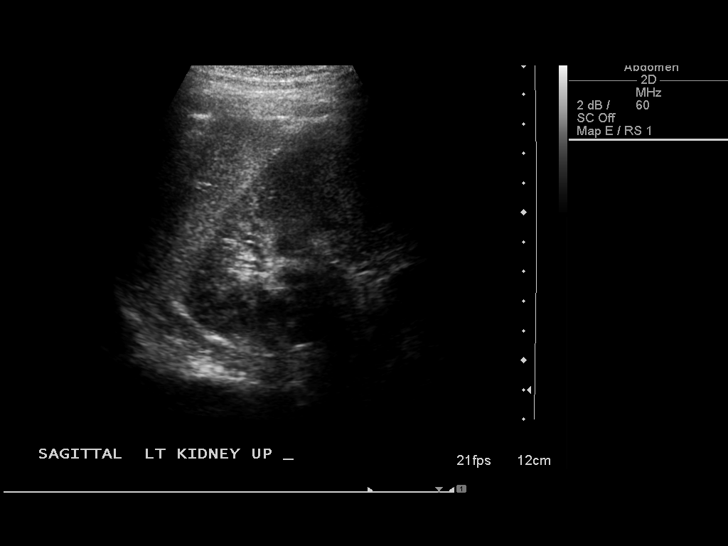
[im 78/85]
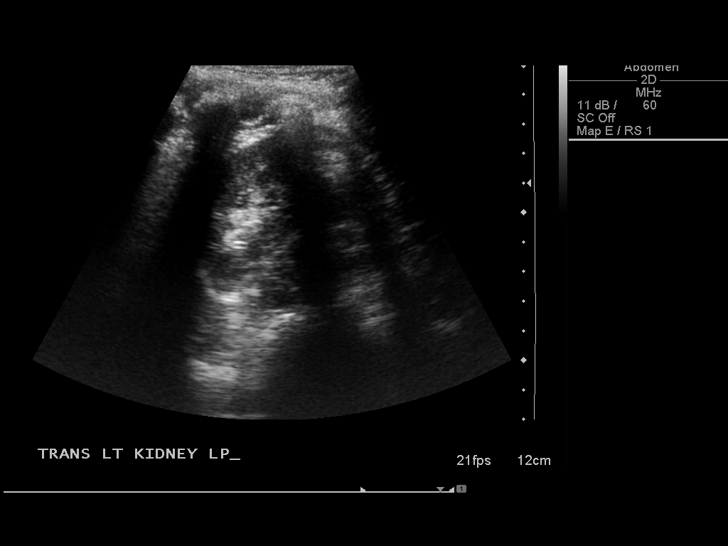
[im 85/85]
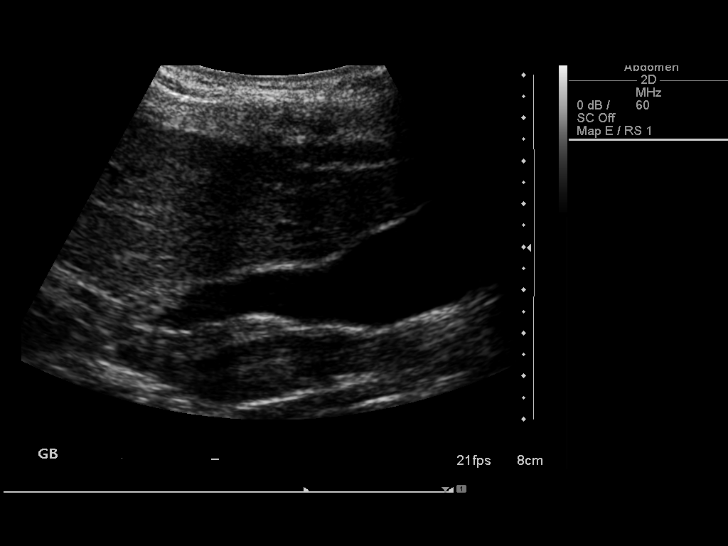

[14 of 25 positions shown; findings below may reference images not displayed]

FINDINGS: Gallbladder:  No gallstones, gallbladder wall thickening, or
pericholecystic fluid.

Common bile duct:   Within normal limits in caliber.

Liver:  No focal lesion identified.  Within normal limits in
parenchymal echogenicity.

IVC:  Appears normal.

Pancreas:  No focal abnormality seen.

Spleen:  Within normal limits in size and echotexture.

Right Kidney:   Normal in size and parenchymal echogenicity.  No
evidence of mass or hydronephrosis.

Left Kidney:  Normal in size and parenchymal echogenicity.  No
evidence of mass or hydronephrosis.

Abdominal aorta:  No aneurysm identified.
IMPRESSION: Negative abdominal ultrasound.

## 2013-03-14 IMAGING — CT CT ABD-PELV W/ CM
1 of 2 series · 15 of 32 positions shown, 19 images · IV contrast (OMNIPAQUE 300)
Comparison: None.

CLINICAL DATA: Right lower quadrant pain and weakness.

CT ABDOMEN AND PELVIS WITH CONTRAST
TECHNIQUE: Multidetector CT imaging of the abdomen and pelvis was
performed following the standard protocol during bolus
administration of intravenous contrast.
Contrast: 100mL OMNIPAQUE IOHEXOL 300 MG/ML  SOLN

[Series 2: abd/pel with · axial · 0.63mm/px · z∈[-68,+312]mm · 15 of 84 slices shown, 19 images]
[im 4/84  soft-tissue]
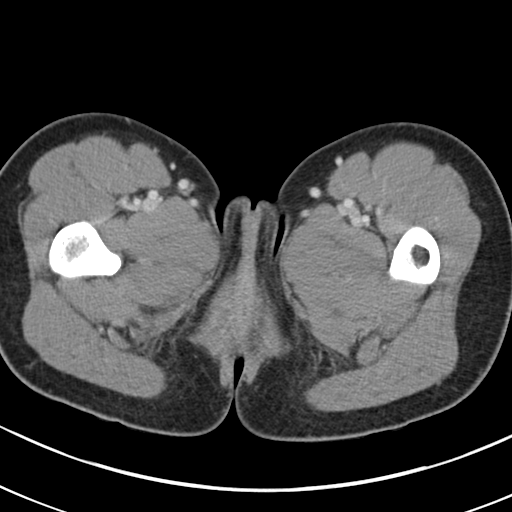
[im 4/84  bone]
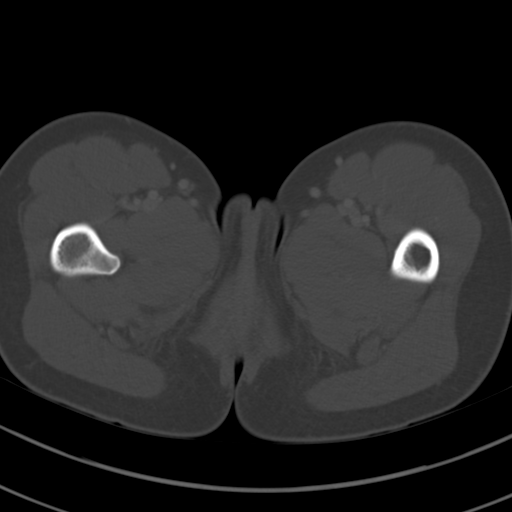
[im 10/84  soft-tissue]
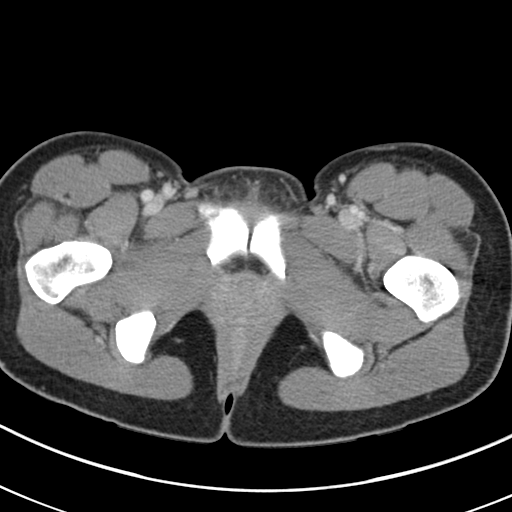
[im 16/84  soft-tissue]
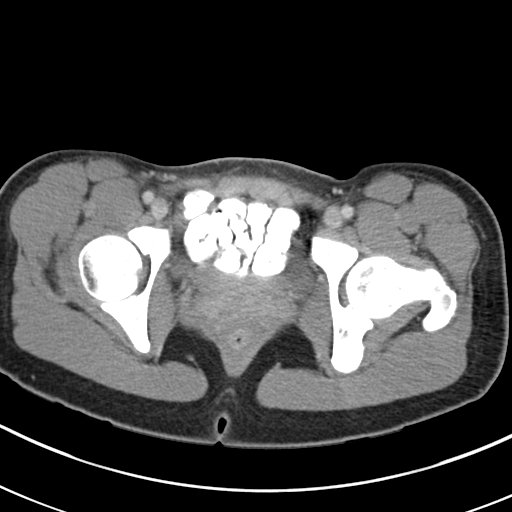
[im 23/84  soft-tissue]
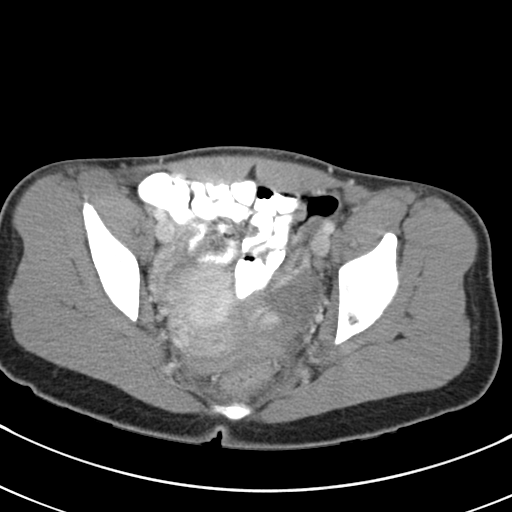
[im 29/84  soft-tissue]
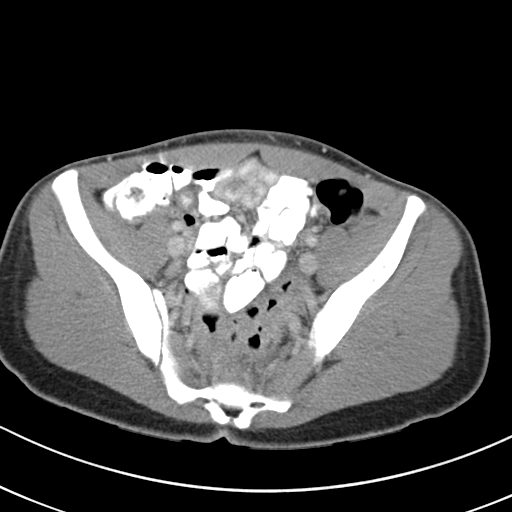
[im 36/84  soft-tissue]
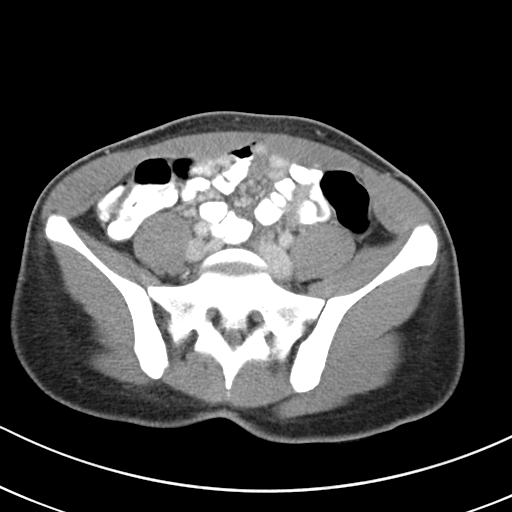
[im 42/84  soft-tissue]
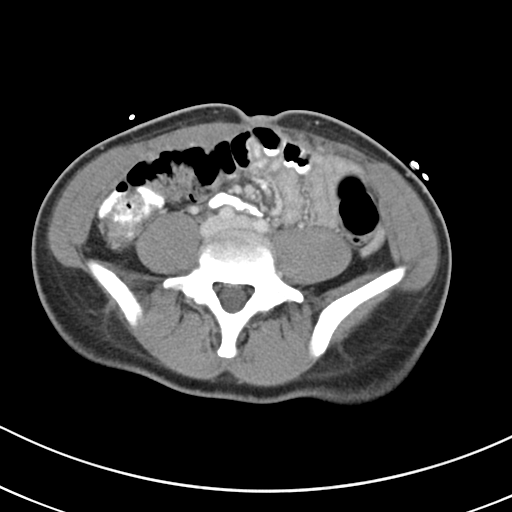
[im 48/84  soft-tissue]
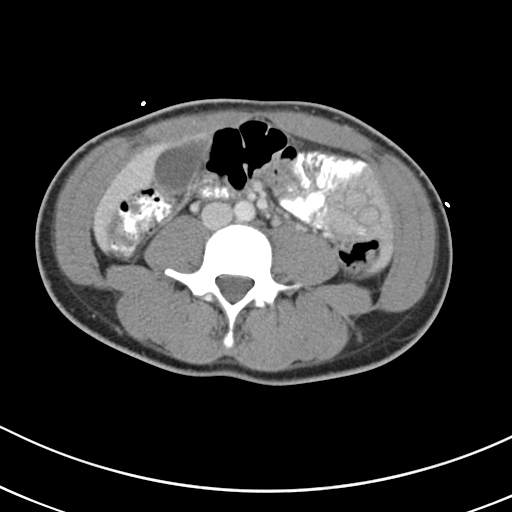
[im 55/84  soft-tissue]
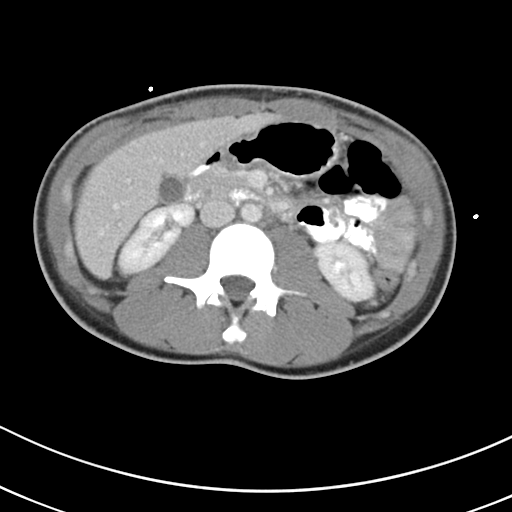
[im 55/84  bone]
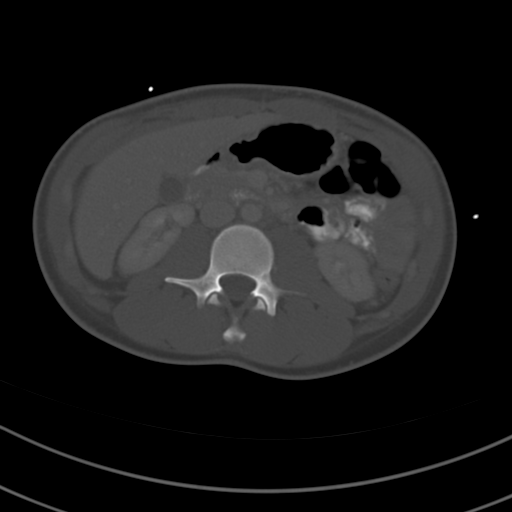
[im 61/84  soft-tissue]
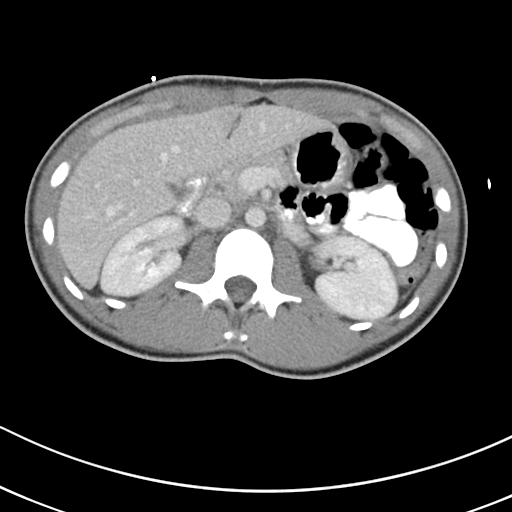
[im 68/84  soft-tissue]
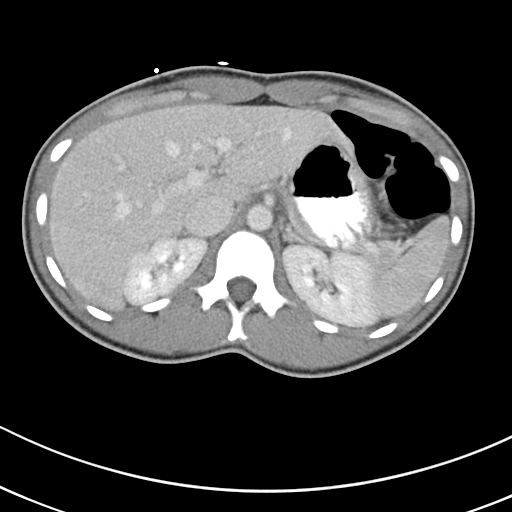
[im 71/84  lung]
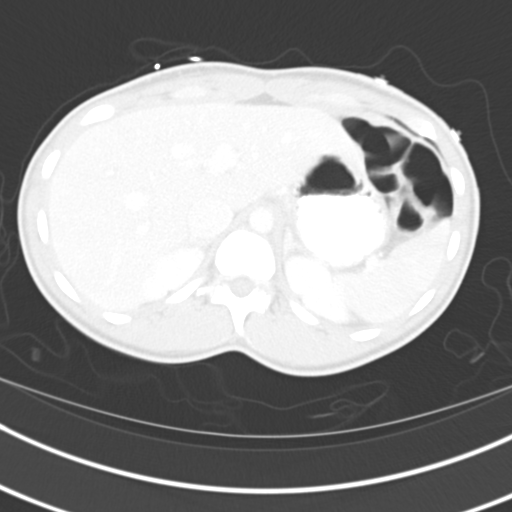
[im 74/84  soft-tissue]
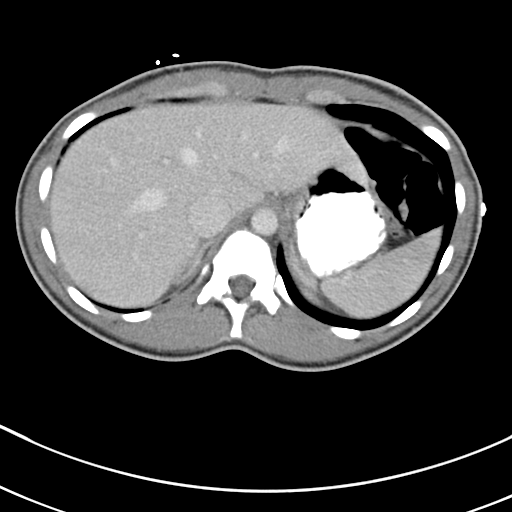
[im 74/84  lung]
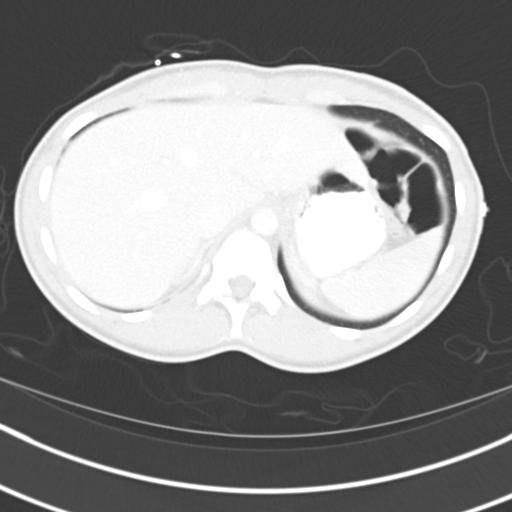
[im 77/84  lung]
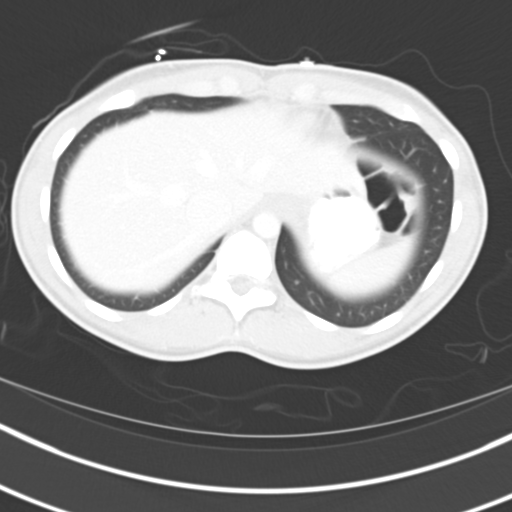
[im 80/84  soft-tissue]
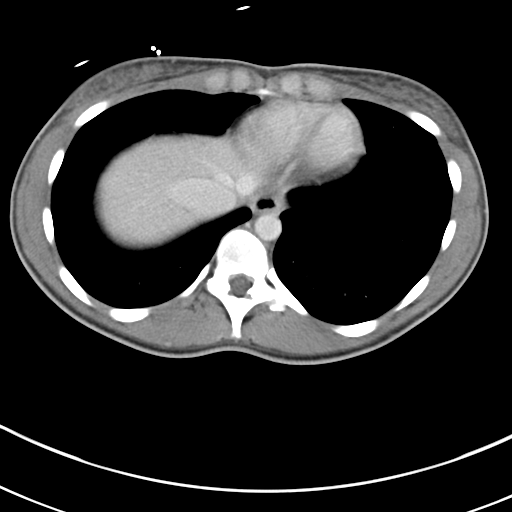
[im 80/84  lung]
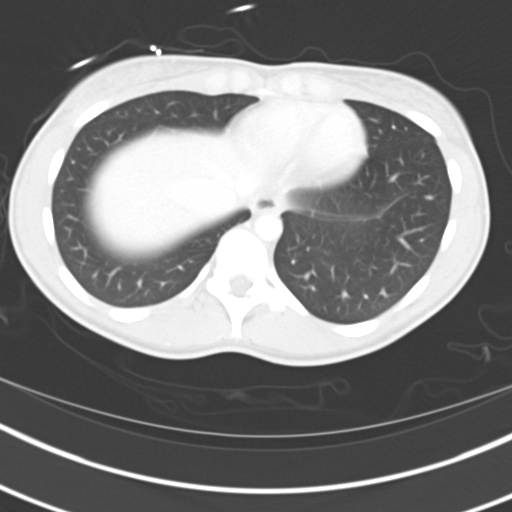

[15 of 32 positions shown; findings below may reference images not displayed]

FINDINGS: The lung bases are clear.

The liver, spleen, gallbladder, pancreas, adrenal glands, kidneys,
abdominal aorta, and retroperitoneal lymph nodes are unremarkable.
The stomach, small bowel, and colon are not abnormally distended.
No free air or free fluid in the abdomen.

Pelvis:  Low attenuation changes in both ovaries likely
representing cysts and measuring about 2.4 cm on the right and
cm on the left.  Small amount of free fluid in the pelvis is likely
to be physiologic.  The uterus is anteverted but is not enlarged.
The bladder is decompressed.  The appendix is not definitively
identified.  No evidence of diverticulitis.  Normal alignment of
the lumbar vertebrae.
IMPRESSION: Probable bilateral ovarian cysts.  Small amount of free fluid in
the pelvis is likely to be physiologic.  Examination is otherwise
unremarkable.

## 2013-04-05 ENCOUNTER — Telehealth: Payer: Self-pay

## 2013-04-05 ENCOUNTER — Encounter (HOSPITAL_COMMUNITY): Payer: Self-pay | Admitting: Emergency Medicine

## 2013-04-05 DIAGNOSIS — F17200 Nicotine dependence, unspecified, uncomplicated: Secondary | ICD-10-CM | POA: Insufficient documentation

## 2013-04-05 DIAGNOSIS — Z3202 Encounter for pregnancy test, result negative: Secondary | ICD-10-CM | POA: Insufficient documentation

## 2013-04-05 DIAGNOSIS — R599 Enlarged lymph nodes, unspecified: Secondary | ICD-10-CM | POA: Insufficient documentation

## 2013-04-05 DIAGNOSIS — A599 Trichomoniasis, unspecified: Secondary | ICD-10-CM | POA: Insufficient documentation

## 2013-04-05 DIAGNOSIS — Z91040 Latex allergy status: Secondary | ICD-10-CM | POA: Insufficient documentation

## 2013-04-05 DIAGNOSIS — N898 Other specified noninflammatory disorders of vagina: Secondary | ICD-10-CM | POA: Insufficient documentation

## 2013-04-05 LAB — URINALYSIS, ROUTINE W REFLEX MICROSCOPIC
BILIRUBIN URINE: NEGATIVE
GLUCOSE, UA: NEGATIVE mg/dL
Hgb urine dipstick: NEGATIVE
KETONES UR: NEGATIVE mg/dL
NITRITE: NEGATIVE
PH: 6 (ref 5.0–8.0)
PROTEIN: NEGATIVE mg/dL
Specific Gravity, Urine: 1.023 (ref 1.005–1.030)
Urobilinogen, UA: 0.2 mg/dL (ref 0.0–1.0)

## 2013-04-05 LAB — URINE MICROSCOPIC-ADD ON

## 2013-04-05 LAB — POCT PREGNANCY, URINE: Preg Test, Ur: NEGATIVE

## 2013-04-05 NOTE — Telephone Encounter (Signed)
Pt left v/m; pt requesting appt to establish as pt. I called pt and she [facility]. Pt has lump between pelvic area and hip bone. Advised pt if cannot wait for appt to contact her previous physician or go to [facility]. Pt voiced understanding.

## 2013-04-05 NOTE — ED Notes (Signed)
Pt. reports "knots" at bilateral groin area with pain on  movement and certain positions , denies  dysuria or vaginal discharge.

## 2013-04-06 ENCOUNTER — Emergency Department (HOSPITAL_COMMUNITY)
Admission: EM | Admit: 2013-04-06 | Discharge: 2013-04-06 | Disposition: A | Discharge status: Home / Self Care | Payer: Managed Care, Other (non HMO) | Attending: Emergency Medicine | Admitting: Emergency Medicine

## 2013-04-06 DIAGNOSIS — A599 Trichomoniasis, unspecified: Secondary | ICD-10-CM

## 2013-04-06 DIAGNOSIS — R590 Localized enlarged lymph nodes: Secondary | ICD-10-CM

## 2013-04-06 HISTORY — DX: Unspecified ovarian cyst, unspecified side: N83.209

## 2013-04-06 LAB — GC/CHLAMYDIA PROBE AMP
CT PROBE, AMP APTIMA: POSITIVE — AB
GC PROBE AMP APTIMA: NEGATIVE

## 2013-04-06 LAB — WET PREP, GENITAL: Yeast Wet Prep HPF POC: NONE SEEN

## 2013-04-06 MED ORDER — CEFTRIAXONE SODIUM 250 MG IJ SOLR
250.0000 mg | Freq: Once | INTRAMUSCULAR | Status: AC
  Administered 2013-04-06: 250 mg via INTRAMUSCULAR
  Filled 2013-04-06: qty 250

## 2013-04-06 MED ORDER — DOXYCYCLINE HYCLATE 100 MG PO CAPS: 100.0000 mg | ORAL_CAPSULE | Freq: Two times a day (BID) | ORAL | Status: DC

## 2013-04-06 MED ORDER — ACETAMINOPHEN 325 MG PO TABS
650.0000 mg | ORAL_TABLET | Freq: Once | ORAL | Status: AC
  Administered 2013-04-06: 650 mg via ORAL
  Filled 2013-04-06: qty 2

## 2013-04-06 MED ORDER — LIDOCAINE HCL (PF) 1 % IJ SOLN
INTRAMUSCULAR | Status: AC
  Administered 2013-04-06: 5 mL
  Filled 2013-04-06: qty 5

## 2013-04-06 MED ORDER — IBUPROFEN 600 MG PO TABS: 600.0000 mg | ORAL_TABLET | Freq: Four times a day (QID) | ORAL | Status: DC | PRN

## 2013-04-06 NOTE — Discharge Instructions (Signed)
Followup with the [facility] health department to assure resolution of your symptoms. Have all sexual partners evaluated and treated. Return for worsening symptoms, fevers, persistent vomiting, abdominal pain or any concerns  Lymphadenopathy Lymphadenopathy means "disease of the lymph glands." But the term is usually used to describe swollen or enlarged lymph glands, also called lymph nodes. These are the bean-shaped organs found in many locations including the neck, underarm, and groin. Lymph glands are part of the immune system, which fights infections in your body. Lymphadenopathy can occur in just one area of the body, such as the neck, or it can be generalized, with lymph node enlargement in several areas. The nodes found in the neck are the most common sites of lymphadenopathy. CAUSES  When your immune system responds to germs (such as viruses or bacteria ), infection-fighting cells and fluid build up. This causes the glands to grow in size. This is usually not something to worry about. Sometimes, the glands themselves can become infected and inflamed. This is called lymphadenitis. Enlarged lymph nodes can be caused by many diseases:  Bacterial disease, such as strep throat or a skin infection.  Viral disease, such as a common cold.  Other germs, such as lyme disease, tuberculosis, or sexually transmitted diseases.  Cancers, such as lymphoma (cancer of the lymphatic system) or leukemia (cancer of the white blood cells).  Inflammatory diseases such as lupus or rheumatoid arthritis.  Reactions to medications. Many of the diseases above are rare, but important. This is why you should see your caregiver if you have lymphadenopathy. SYMPTOMS   Swollen, enlarged lumps in the neck, back of the head or other locations.  Tenderness.  Warmth or redness of the skin over the lymph nodes.  Fever. DIAGNOSIS  Enlarged lymph nodes are often near the source of infection. They can help  healthcare providers diagnose your illness. For instance:   Swollen lymph nodes around the jaw might be caused by an infection in the mouth.  Enlarged glands in the neck often signal a throat infection.  Lymph nodes that are swollen in more than one area often indicate an illness caused by a virus. Your caregiver most likely will know what is causing your lymphadenopathy after listening to your history and examining you. Blood tests, x-rays or other tests may be needed. If the cause of the enlarged lymph node cannot be found, and it does not go away by itself, then a biopsy may be needed. Your caregiver will discuss this with you. TREATMENT  Treatment for your enlarged lymph nodes will depend on the cause. Many times the nodes will shrink to normal size by themselves, with no treatment. Antibiotics or other medicines may be needed for infection. Only take over-the-counter or prescription medicines for pain, discomfort or fever as directed by your caregiver. HOME CARE INSTRUCTIONS  Swollen lymph glands usually return to normal when the underlying medical condition goes away. If they persist, contact your health-care provider. He/she might prescribe antibiotics or other treatments, depending on the diagnosis. Take any medications exactly as prescribed. Keep any follow-up appointments made to check on the condition of your enlarged nodes.  SEEK MEDICAL CARE IF:   Swelling lasts for more than two weeks.  You have symptoms such as weight loss, night sweats, fatigue or fever that does not go away.  The lymph nodes are hard, seem fixed to the skin or are growing rapidly.  Skin over the lymph nodes is red and inflamed. This could mean there is an  infection. SEEK IMMEDIATE MEDICAL CARE IF:   Fluid starts leaking from the area of the enlarged lymph node.  You develop a fever of 102 F (38.9 C) or greater.  Severe pain develops (not necessarily at the site of a large lymph node).  You develop  chest pain or shortness of breath.  You develop worsening abdominal pain. MAKE SURE YOU:   Understand these instructions.  Will watch your condition.  Will get help right away if you are not doing well or get worse. Document Released: 12/18/2007 Document Revised: 06/02/2011 Document Reviewed: 12/18/2007 [facility] Patient Information 2014 [place].  Trichomoniasis Trichomoniasis is an infection, caused by the Trichomonas organism, that affects both women and men. In women, the outer female genitalia and the vagina are affected. In men, the penis is mainly affected, but the prostate and other reproductive organs can also be involved. Trichomoniasis is a sexually transmitted disease (STD) and is most often passed to another person through sexual contact. The majority of people who get trichomoniasis do so from a sexual encounter and are also at risk for other STDs. CAUSES   Sexual intercourse with an infected partner.  It can be present in swimming pools or hot tubs. SYMPTOMS   Abnormal gray-green frothy vaginal discharge in women.  Vaginal itching and irritation in women.  Itching and irritation of the area outside the vagina in women.  Penile discharge with or without pain in males.  Inflammation of the urethra (urethritis), causing painful urination.  Bleeding after sexual intercourse. RELATED COMPLICATIONS  Pelvic inflammatory disease.  Infection of the uterus (endometritis).  Infertility.  Tubal (ectopic) pregnancy.  It can be associated with other STDs, including gonorrhea and chlamydia, hepatitis B, and HIV. COMPLICATIONS DURING PREGNANCY  Early (premature) delivery.  Premature rupture of the membranes (PROM).  Low birth weight. DIAGNOSIS   Visualization of Trichomonas under the microscope from the vagina discharge.  Ph of the vagina greater than 4.5, tested with a test tape.  Trich Rapid Test.  Culture of the organism, but this is not usually  needed.  It may be found on a Pap test.  Having a "strawberry cervix,"which means the cervix looks very red like a strawberry. TREATMENT   You may be given medication to fight the infection. Inform your caregiver if you could be or are pregnant. Some medications used to treat the infection should not be taken during pregnancy.  Over-the-counter medications or creams to decrease itching or irritation may be recommended.  Your sexual partner will need to be treated if infected. HOME CARE INSTRUCTIONS   Take all medication prescribed by your caregiver.  Take over-the-counter medication for itching or irritation as directed by your caregiver.  Do not have sexual intercourse while you have the infection.  Do not douche or wear tampons.  Discuss your infection with your partner, as your partner may have acquired the infection from you. Or, your partner may have been the person who transmitted the infection to you.  Have your sex partner examined and treated if necessary.  Practice safe, informed, and protected sex.  See your caregiver for other STD testing. SEEK MEDICAL CARE IF:   You still have symptoms after you finish the medication.  You have an oral temperature above 102 F (38.9 C).  You develop belly (abdominal) pain.  You have pain when you urinate.  You have bleeding after sexual intercourse.  You develop a rash.  The medication makes you sick or makes you throw up (vomit). Document  Released: 09/03/2000 Document Revised: 06/02/2011 Document Reviewed: 09/29/2008 [facility] Patient Information 2014 [place], [place].

## 2013-04-06 NOTE — ED Provider Notes (Signed)
CSN: [id number]     Arrival date & time 04/05/13  2254 History   First MD Initiated Contact with Patient 04/06/13 0202     Chief Complaint  Patient presents with  . Groin Pain   (Consider location/radiation/quality/duration/timing/severity/associated sxs/prior Treatment) HPI Patient presents with several days of "knots" in groin and area that are tender. She's had increased vaginal discharge though she states she has normally a mild discharge. She's had no fever or chills. She denies any vaginal bleeding. She is sexually active. Denies any previous sexually transmitted diseases. Past Medical History  Diagnosis Date  . Ovarian cyst    Past Surgical History  Procedure Laterality Date  . Hernia repair     No family history on file. History  Substance Use Topics  . Smoking status: Current Some Day Smoker  . Smokeless tobacco: Not on file  . Alcohol Use: Yes   OB History   Grav Para Term Preterm Abortions TAB SAB Ect Mult Living                 Review of Systems  Constitutional: Negative for fever and chills.  Gastrointestinal: Negative for abdominal pain.  Genitourinary: Positive for vaginal discharge and pelvic pain. Negative for urgency, flank pain and vaginal bleeding.  Musculoskeletal: Negative for back pain.  Skin: Negative for rash and wound.  Neurological: Negative for dizziness, syncope, weakness, light-headedness and numbness.  All other systems reviewed and are negative.    Allergies  Latex  Home Medications  No current outpatient prescriptions on file. BP 117/74  Pulse 102  Temp(Src) 99.7 F (37.6 C) (Oral)  Resp 14  Ht 5\' 5"  (1.651 m)  Wt 122 lb (55.339 kg)  BMI 20.30 kg/m2  SpO2 100%  LMP 03/21/2013 Physical Exam  Nursing note and vitals reviewed. Constitutional: She is oriented to person, place, and time. She appears well-developed and well-nourished. No distress.  HENT:  Head: Normocephalic and atraumatic.  Mouth/Throat: Oropharynx is clear and  moist.  Eyes: EOM are normal. Pupils are equal, round, and reactive to light.  Neck: Normal range of motion. Neck supple.  Cardiovascular: Normal rate and regular rhythm.   Pulmonary/Chest: Effort normal and breath sounds normal. No respiratory distress. She has no wheezes. She has no rales.  Abdominal: Soft. Bowel sounds are normal. She exhibits no distension and no mass. There is no tenderness. There is no rebound and no guarding.  Genitourinary:  Bilateral inguinal lymphadenopathy. Copious frothy white vaginal discharge. No cervical motion tenderness. No fundal or adnexal tenderness.  Musculoskeletal: Normal range of motion. She exhibits no edema and no tenderness.  No calf swelling or tenderness.  Neurological: She is alert and oriented to person, place, and time.  Moves all extremities without deficit. Sensation grossly intact.  Skin: Skin is warm and dry. No rash noted. No erythema.  Psychiatric: She has a normal mood and affect. Her behavior is normal.    ED Course  Procedures (including critical care time) Labs Review Labs Reviewed  URINALYSIS, ROUTINE W REFLEX MICROSCOPIC - Abnormal; Notable for the following:    APPearance CLOUDY (*)    Leukocytes, UA SMALL (*)    All other components within normal limits  URINE MICROSCOPIC-ADD ON - Abnormal; Notable for the following:    Squamous Epithelial / LPF FEW (*)    Bacteria, UA FEW (*)    All other components within normal limits  URINE CULTURE  WET PREP, GENITAL  GC/CHLAMYDIA PROBE AMP  POCT PREGNANCY, URINE   Imaging  Review No results found.  EKG Interpretation   None       MDM  We'll treat patient broadly for sexually transmitted diseases. [facility]. Return precautions have been given.   Loren Raceravid Anju Sereno, MD 04/06/13 [phone]

## 2013-04-07 LAB — URINE CULTURE

## 2013-04-08 ENCOUNTER — Telehealth (HOSPITAL_COMMUNITY): Payer: Self-pay

## 2013-04-08 NOTE — ED Notes (Addendum)
[facility] (+) Chlamydia Treated with Rocephin and Rx for Doxycycline. DHHS form completed and faxed.  Call and notify patient.    Post ED Visit - Positive Culture Follow-up: Successful Patient Follow-Up  Culture assessed and recommendations reviewed by: [x]  Wes Dulaney, Pharm.D., BCPS []  Celedonio MiyamotoJeremy Frens, [address] Rainbow BoulevardPharm.D., BCPS []  Georgina PillionElizabeth Martin, Pharm.D., BCPS []  WoxallMinh Pham, [place]D., BCPS, AAHIVP []  Estella HuskMichelle Turner, Pharm.D., BCPS, AAHIVP  Positive Trich culture  [x]  Patient discharged without antimicrobial prescription and treatment is now indicated []  Organism is resistant to prescribed ED discharge antimicrobial []  Patient with positive blood cultures  Changes discussed with ED provider: Trixie DredgeEmily West PA New antibiotic prescription Flagyl 2 grams po 1 x dose. [phone] and Rx for Flagyl given to RPh.  Contacted patient, date 04/08/13, time 10:49 Spoke w/ pt. ID verified x 2.  Informed of Dx of chlamydia (txd appropriately) and Trich (needs tx).  Pt also informed of need to inform partner(s) for testing and tx and abstain from sex x 3 wks( 2 wks s/p completion of Doxy.)    Arvid RightClark, Davi Rotan Dorn 04/08/2013, 10:47 AM

## 2013-04-08 NOTE — Progress Notes (Signed)
ED Antimicrobial Stewardship Positive Culture Follow Up   Amanda Carter is an 23 y.o. female who presented to [facility] on 04/06/2013 with a chief complaint of  Chief Complaint  Patient presents with  . Groin Pain    Recent Results (from the past 720 hour(s))  URINE CULTURE     Status: None   Collection Time    04/05/13 11:08 PM      Result Value Range Status   Specimen Description URINE, CLEAN CATCH   Final   Special Requests NONE   Final   Culture  Setup Time     Final   Value: 04/06/2013 06:13     Performed at [facility]   Colony Count     Final   Value: 15,000 COLONIES/ML     Performed at [facility]   Culture     Final   Value: GROUP B STREP(S.AGALACTIAE)ISOLATED     Note: TESTING AGAINST S. AGALACTIAE NOT ROUTINELY PERFORMED DUE TO PREDICTABILITY OF AMP/PEN/VAN SUSCEPTIBILITY.     Performed at [facility]   Report Status 04/07/2013 FINAL   Final  WET PREP, GENITAL     Status: Abnormal   Collection Time    04/06/13  4:02 AM      Result Value Range Status   Yeast Wet Prep HPF POC NONE SEEN  NONE SEEN Final   Trich, Wet Prep FEW (*) NONE SEEN Final   Clue Cells Wet Prep HPF POC MODERATE (*) NONE SEEN Final   WBC, Wet Prep HPF POC FEW (*) NONE SEEN Final  GC/CHLAMYDIA PROBE AMP     Status: Abnormal   Collection Time    04/06/13  4:02 AM      Result Value Range Status   CT Probe RNA POSITIVE (*) NEGATIVE Final   Comment: (NOTE)     A Positive CT or NG Nucleic Acid Amplification Test (NAAT) result     should be considered presumptive evidence of infection.  The result     should be evaluated along with physical examination and other     diagnostic findings.   GC Probe RNA NEGATIVE  NEGATIVE Final   Comment: (NOTE)                                                                                               **Normal Reference Range: Negative**          Assay performed using the Gen-Probe APTIMA COMBO2 (R) Assay.     Acceptable specimen  types for this assay include APTIMA Swabs (Unisex,     endocervical, urethral, or vaginal), first void urine, and ThinPrep     liquid based cytology samples.     Performed at [facility]    Patient received adequate treatment for chlamydia but did not receive appropriate treatment for trichomoniasis. No additional treatment needed for positive urine culture.     New antibiotic prescription: Metronidazole 2g PO x 1  ED Provider: Trixie DredgeEmily West, PA-C   Cleon DewDulaney, [place] 04/08/2013, 3:00 PM Infectious Diseases Pharmacist Phone# [phone]

## 2013-04-18 ENCOUNTER — Ambulatory Visit (INDEPENDENT_AMBULATORY_CARE_PROVIDER_SITE_OTHER): Payer: Managed Care, Other (non HMO) | Admitting: Physician Assistant

## 2013-04-18 ENCOUNTER — Encounter: Payer: Self-pay | Admitting: Physician Assistant

## 2013-04-18 VITALS — BP 110/70 | HR 94 | Temp 98.0°F | Resp 20 | Ht 66.0 in | Wt 121.1 lb

## 2013-04-18 DIAGNOSIS — Z0000 Encounter for general adult medical examination without abnormal findings: Secondary | ICD-10-CM

## 2013-04-18 NOTE — Patient Instructions (Signed)
It was great meeting you today Amanda Carter!  Labs have been ordered for you, when you report to lab please be fasting.    Health Maintenance, Female A healthy lifestyle and preventative care can promote health and wellness.  Maintain regular health, dental, and eye exams.  Eat a healthy diet. Foods like vegetables, fruits, whole grains, low-fat dairy products, and lean protein foods contain the nutrients you need without too many calories. Decrease your intake of foods high in solid fats, added sugars, and salt. Get information about a proper diet from your caregiver, if necessary.  Regular physical exercise is one of the most important things you can do for your health. Most adults should get at least 150 minutes of moderate-intensity exercise (any activity that increases your heart rate and causes you to sweat) each week. In addition, most adults need muscle-strengthening exercises on 2 or more days a week.   Maintain a healthy weight. The body mass index (BMI) is a screening tool to identify possible weight problems. It provides an estimate of body fat based on height and weight. Your caregiver can help determine your BMI, and can help you achieve or maintain a healthy weight. For adults 20 years and older:  A BMI below 18.5 is considered underweight.  A BMI of 18.5 to 24.9 is normal.  A BMI of 25 to 29.9 is considered overweight.  A BMI of 30 and above is considered obese.  Maintain normal blood lipids and cholesterol by exercising and minimizing your intake of saturated fat. Eat a balanced diet with plenty of fruits and vegetables. Blood tests for lipids and cholesterol should begin at age 22 and be repeated every 5 years. If your lipid or cholesterol levels are high, you are over 50, or you are a high risk for heart disease, you may need your cholesterol levels checked more frequently.Ongoing high lipid and cholesterol levels should be treated with medicines if diet and exercise are not  effective.  If you smoke, find out from your caregiver how to quit. If you do not use tobacco, do not start.  Lung cancer screening is recommended for adults aged 64 80 years who are at high risk for developing lung cancer because of a history of smoking. Yearly low-dose computed tomography (CT) is recommended for people who have at least a 30-pack-year history of smoking and are a current smoker or have quit within the past 15 years. A pack year of smoking is smoking an average of 1 pack of cigarettes a day for 1 year (for example: 1 pack a day for 30 years or 2 packs a day for 15 years). Yearly screening should continue until the smoker has stopped smoking for at least 15 years. Yearly screening should also be stopped for people who develop a health problem that would prevent them from having lung cancer treatment.  If you are pregnant, do not drink alcohol. If you are breastfeeding, be very cautious about drinking alcohol. If you are not pregnant and choose to drink alcohol, do not exceed 1 drink per day. One drink is considered to be 12 ounces (355 mL) of beer, 5 ounces (148 mL) of wine, or 1.5 ounces (44 mL) of liquor.  Avoid use of street drugs. Do not share needles with anyone. Ask for help if you need support or instructions about stopping the use of drugs.  High blood pressure causes heart disease and increases the risk of stroke. Blood pressure should be checked at least every 1  to 2 years. Ongoing high blood pressure should be treated with medicines, if weight loss and exercise are not effective.  If you are 62 to 23 years old, ask your caregiver if you should take aspirin to prevent strokes.  Diabetes screening involves taking a blood sample to check your fasting blood sugar level. This should be done once every 3 years, after age 83, if you are within normal weight and without risk factors for diabetes. Testing should be considered at a younger age or be carried out more frequently if you  are overweight and have at least 1 risk factor for diabetes.  Breast cancer screening is essential preventative care for women. You should practice "breast self-awareness." This means understanding the normal appearance and feel of your breasts and may include breast self-examination. Any changes detected, no matter how small, should be reported to a caregiver. Women in their 62s and 30s should have a clinical breast exam (CBE) by a caregiver as part of a regular health exam every 1 to 3 years. After age 25, women should have a CBE every year. Starting at age 73, women should consider having a mammogram (breast X-ray) every year. Women who have a family history of breast cancer should talk to their caregiver about genetic screening. Women at a high risk of breast cancer should talk to their caregiver about having an MRI and a mammogram every year.  Breast cancer gene (BRCA)-related cancer risk assessment is recommended for women who have family members with BRCA-related cancers. BRCA-related cancers include breast, ovarian, tubal, and peritoneal cancers. Having family members with these cancers may be associated with an increased risk for harmful changes (mutations) in the breast cancer genes BRCA1 and BRCA2. Results of the assessment will determine the need for genetic counseling and BRCA1 and BRCA2 testing.  The Pap test is a screening test for cervical cancer. Women should have a Pap test starting at age 42. Between ages 35 and 65, Pap tests should be repeated every 2 years. Beginning at age 57, you should have a Pap test every 3 years as long as the past 3 Pap tests have been normal. If you had a hysterectomy for a problem that was not cancer or a condition that could lead to cancer, then you no longer need Pap tests. If you are between ages 27 and 43, and you have had normal Pap tests going back 10 years, you no longer need Pap tests. If you have had past treatment for cervical cancer or a condition that  could lead to cancer, you need Pap tests and screening for cancer for at least 20 years after your treatment. If Pap tests have been discontinued, risk factors (such as a new sexual partner) need to be reassessed to determine if screening should be resumed. Some women have medical problems that increase the chance of getting cervical cancer. In these cases, your caregiver may recommend more frequent screening and Pap tests.  The human papillomavirus (HPV) test is an additional test that may be used for cervical cancer screening. The HPV test looks for the virus that can cause the cell changes on the cervix. The cells collected during the Pap test can be tested for HPV. The HPV test could be used to screen women aged 68 years and older, and should be used in women of any age who have unclear Pap test results. After the age of 60, women should have HPV testing at the same frequency as a Pap test.  Colorectal  cancer can be detected and often prevented. Most routine colorectal cancer screening begins at the age of 65 and continues through age 4. However, your caregiver may recommend screening at an earlier age if you have risk factors for colon cancer. On a yearly basis, your caregiver may provide home test kits to check for hidden blood in the stool. Use of a small camera at the end of a tube, to directly examine the colon (sigmoidoscopy or colonoscopy), can detect the earliest forms of colorectal cancer. Talk to your caregiver about this at age 35, when routine screening begins. Direct examination of the colon should be repeated every 5 to 10 years through age 10, unless early forms of pre-cancerous polyps or small growths are found.  Hepatitis C blood testing is recommended for all people born from [birth year] through 1965 and any individual with known risks for hepatitis C.  Practice safe sex. Use condoms and avoid high-risk sexual practices to reduce the spread of sexually transmitted infections (STIs). Sexually  active women aged 8 and younger should be checked for Chlamydia, which is a common sexually transmitted infection. Older women with new or multiple partners should also be tested for Chlamydia. Testing for other STIs is recommended if you are sexually active and at increased risk.  Osteoporosis is a disease in which the bones lose minerals and strength with aging. This can result in serious bone fractures. The risk of osteoporosis can be identified using a bone density scan. Women ages 29 and over and women at risk for fractures or osteoporosis should discuss screening with their caregivers. Ask your caregiver whether you should be taking a calcium supplement or vitamin D to reduce the rate of osteoporosis.  Menopause can be associated with physical symptoms and risks. Hormone replacement therapy is available to decrease symptoms and risks. You should talk to your caregiver about whether hormone replacement therapy is right for you.  Use sunscreen. Apply sunscreen liberally and repeatedly throughout the day. You should seek shade when your shadow is shorter than you. Protect yourself by wearing long sleeves, pants, a wide-brimmed hat, and sunglasses year round, whenever you are outdoors.  Notify your caregiver of new moles or changes in moles, especially if there is a change in shape or color. Also notify your caregiver if a mole is larger than the size of a pencil eraser.  Stay current with your immunizations. Document Released: 09/23/2010 Document Revised: 07/05/2012 Document Reviewed: 09/23/2010 [facility] Patient Information 2014 [employer].

## 2013-04-18 NOTE — Progress Notes (Signed)
Subjective:    Patient ID: Amanda Carter, female    DOB: Mar 01, 1991, 23 y.o.   MRN: [id number]  HPI Comments: Patient is a 23 year old female who presents to clinic to establish care. Patient reports she considers herself in good health. 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 Patient reports she was seen in ED recently and diagnosed with Trichomoniasis. Reports she has taken all medications as prescribed to completion and has an improvement of her symptoms. Presents today for physical exam and GYN referral. Denies fever, fatigue, change in bowel/bladder habits, blood in urine/stool, chest pain/palpitations, cough or SOB. Denies other concerns at this time.      Review of Systems  Constitutional: Negative for fever and chills.  HENT: Negative for trouble swallowing.   Eyes: Negative for pain and visual disturbance.  Respiratory: Negative for cough, chest tightness and shortness of breath.   Cardiovascular: Negative for chest pain and palpitations.  Gastrointestinal: Negative for nausea and vomiting.  Genitourinary: Positive for vaginal discharge (resolving).       Swelling to right groin area, improving  Skin: Negative for rash.  Neurological: Negative for dizziness, weakness, light-headedness and numbness.       Objective:   Physical Exam  Vitals reviewed. Constitutional: She is oriented to person, place, and time. She appears well-developed and well-nourished. No distress.  HENT:  Head: Normocephalic and atraumatic.  Right Ear: Hearing, tympanic membrane, external ear and ear canal normal.  Left Ear: Hearing, tympanic membrane, external ear and ear canal normal.  Nose: Nose normal.  Mouth/Throat: Uvula is midline, oropharynx is clear and moist and mucous membranes are normal.  Eyes: Conjunctivae and EOM are normal. Pupils are equal, round, and reactive to light.  Neck: Trachea normal and normal range of motion. No mass and no thyromegaly  present.  Cardiovascular: Normal rate, regular rhythm, S1 normal and S2 normal.  Exam reveals no gallop and no friction rub.   No murmur heard. Pulses:      Radial pulses are 2+ on the right side, and 2+ on the left side.       Dorsalis pedis pulses are 2+ on the right side, and 2+ on the left side.  Pulmonary/Chest: Effort normal and breath sounds normal. She has no wheezes. She has no rhonchi. She has no rales.  Abdominal: Soft. Normal appearance and bowel sounds are normal. There is no tenderness.  Genitourinary:  Deferred to GYN  Musculoskeletal:  FROM U/LE bilateral  Lymphadenopathy:    She has no cervical adenopathy.       Right: Inguinal (mildly tender to palpation) adenopathy present. No supraclavicular adenopathy present.       Left: No inguinal and no supraclavicular adenopathy present.  Neurological: She is alert and oriented to person, place, and time. She has normal strength. No sensory deficit. She displays a negative Romberg sign. Coordination and gait normal.  Skin: Skin is warm and dry.  Psychiatric: She has a normal mood and affect. Her speech is normal and behavior is normal.          Assessment & Plan:    HPI  ROS: As stated in HPI. All other systems negative  PE: CONSTITUTIONAL: Well developed, well nourished, pleasant, appears stated age, in NAD HEENT: normocephalic, atraumatic, bilateral ext/int canals normal. Bilateral TM's without injections, bulging, erythema. Nose normal, uvula midline, oropharynx clear and moist. EYES: PERRLA, bilateral EOM and conjunctiva normal NECK: FROM, supple, without thyromegaly or mass CARDIO:  RRR, normal S1 and S2, distal pulses intact. PULM/CHEST CTA bilateral, no wheezes, rales or rhonchi. Non tender. ABD: appearance normal, soft, nontender. Normal bowel sounds x 4 quadrants GU: deferred MUSC: FROM U/LE bilateral LYMPH: no cervical, supraclavicular adenopathy NEURO: alert and oriented x 3, no cranial nerve deficit,  motor strength and coordination NL. Negative romberg. Gait normal. SKIN: warm, dry, no rash or lesions noted. PSYCH: Mood and affect normal, speech normal.  Past Medical History  Diagnosis Date  . Ovarian cyst   . Chicken pox   . Drug abuse     Alcohol Abuse  . Exposure to STD     Trichominosis  . Enlarged lymph node     Swelling   Family History  Problem Relation Age of Onset  . Stroke Mother   . Hypertension Mother   . Mental illness Sister   . Cancer Maternal Aunt     Breast/passed in 2011  . Arthritis Maternal Grandmother   . Cancer Maternal Grandmother     breast  . Hypertension Maternal Grandmother   . Diabetes Maternal Grandmother    History   Social History  . Marital Status: Single    Spouse Name: N/A    Number of Children: N/A  . Years of Education: N/A   Social History Main Topics  . Smoking status: Current Some Day Smoker  . Smokeless tobacco: None  . Alcohol Use: .5 - 1.5 oz/week    1-3 drink(s) per week     Comment: Social  . Drug Use: Yes    Special: Marijuana  . Sexual Activity: Yes    Partners: Male    Birth Control/ Protection: None   Other Topics Concern  . None   Social History Narrative  . None   Past Surgical History  Procedure Laterality Date  . Hernia repair  2001  . Gastrostomy w/ feeding tube  1992    Difficulty swallowing&Digesting   Lab Results  Component Value Date   WBC 11.7* 01/24/2012   HGB 11.7* 01/24/2012   HCT 33.4* 01/24/2012   PLT 310 01/24/2012   GLUCOSE 69* 01/24/2012   ALT 8 01/24/2012   AST 22 01/24/2012   NA 137 01/24/2012   K 3.8 01/24/2012   CL 101 01/24/2012   CREATININE 0.71 01/24/2012   BUN 9 01/24/2012   CO2 24 01/24/2012     ASSESSMENT and PLAN   CPX/v70.0 - Patient has been counseled on age-appropriate routine health concerns for screening and prevention. These are reviewed and up-to-date. Immunizations are up-to-date or declined. Labs ordered and will be reviewed.

## 2013-04-18 NOTE — Progress Notes (Signed)
Pre-visit discussion using our clinic review tool. No additional management support is needed unless otherwise documented below in the visit note.  

## 2013-06-20 ENCOUNTER — Encounter: Payer: Managed Care, Other (non HMO) | Admitting: Family Medicine

## 2013-09-07 ENCOUNTER — Encounter: Payer: Self-pay | Admitting: Obstetrics and Gynecology

## 2013-09-07 ENCOUNTER — Ambulatory Visit (INDEPENDENT_AMBULATORY_CARE_PROVIDER_SITE_OTHER): Payer: Managed Care, Other (non HMO) | Admitting: Obstetrics and Gynecology

## 2013-09-07 VITALS — BP 113/73 | HR 84 | Temp 97.8°F | Resp 20 | Ht 64.5 in | Wt 120.4 lb

## 2013-09-07 DIAGNOSIS — Z01419 Encounter for gynecological examination (general) (routine) without abnormal findings: Secondary | ICD-10-CM

## 2013-09-07 MED ORDER — NORGESTIMATE-ETH ESTRADIOL 0.25-35 MG-MCG PO TABS: 1.0000 | ORAL_TABLET | Freq: Every day | ORAL | Status: DC

## 2013-09-07 NOTE — Progress Notes (Signed)
  Subjective:     Amanda Carter is a 23 y.o. female G0 with LMP 09/01/2013 who is here for a comprehensive physical exam. The patient reports no problems. She is sexually active using condoms for contraception. Patient was on OCP and desires to be restarted on that method.  History   Social History  . Marital Status: Single    Spouse Name: N/A    Number of Children: N/A  . Years of Education: N/A   Occupational History  . Not on file.   Social History Main Topics  . Smoking status: Current Some Day Smoker  . Smokeless tobacco: Not on file  . Alcohol Use: 0.5 - 1.5 oz/week    1-3 drink(s) per week     Comment: Social  . Drug Use: Yes    Special: Marijuana     Comment: current use  . Sexual Activity: Yes    Partners: Male    Birth Control/ Protection: Condom   Other Topics Concern  . Not on file   Social History Narrative  . No narrative on file   Health Maintenance  Topic Date Due  . Pap Smear  01/07/2009  . Tetanus/tdap  01/07/2010  . Influenza Vaccine  10/22/2013   Past Medical History  Diagnosis Date  . Ovarian cyst   . Chicken pox   . Drug abuse     Alcohol Abuse  . Exposure to STD     Trichominosis  . Enlarged lymph node     Swelling   Past Surgical History  Procedure Laterality Date  . Hernia repair  2001  . Gastrostomy w/ feeding tube  1992    Difficulty swallowing&Digesting        Review of Systems A comprehensive review of systems was negative.   Objective:      GENERAL: Well-developed, well-nourished female in no acute distress.  HEENT: Normocephalic, atraumatic. Sclerae anicteric.  NECK: Supple. Normal thyroid.  LUNGS: Clear to auscultation bilaterally.  HEART: Regular rate and rhythm. BREASTS: Symmetric in size. No palpable masses or lymphadenopathy, skin changes, or nipple drainage. ABDOMEN: Soft, nontender, nondistended. No organomegaly. PELVIC: Normal external female genitalia. Vagina is pink and rugated.  Thick discharge.  Normal appearing cervix. Uterus is normal in size. No adnexal mass or tenderness. EXTREMITIES: No cyanosis, clubbing, or edema, 2+ distal pulses.    Assessment:    Healthy female exam.      Plan:    Pap smear collected Wet prep also collected Rx Sprintec See After Visit Summary for Counseling Recommendations

## 2013-09-08 ENCOUNTER — Telehealth: Payer: Self-pay | Admitting: *Deleted

## 2013-09-08 LAB — WET PREP, GENITAL: Trich, Wet Prep: NONE SEEN

## 2013-09-08 MED ORDER — METRONIDAZOLE 500 MG PO TABS: 500.0000 mg | ORAL_TABLET | Freq: Two times a day (BID) | ORAL | Status: DC

## 2013-09-08 MED ORDER — FLUCONAZOLE 150 MG PO TABS: 150.0000 mg | ORAL_TABLET | Freq: Once | ORAL | Status: DC

## 2013-09-08 NOTE — Telephone Encounter (Signed)
Called patient and informed her of results. She had no further questions.  

## 2013-09-08 NOTE — Telephone Encounter (Signed)
Message copied by Mannie StabileASH, AMANDA A on Thu Sep 08, 2013  8:47 AM ------      Message from: CONSTANT, PEGGY      Created: Thu Sep 08, 2013  8:39 AM       Please inform patient of both yeast and BV infection. Rx for diflucan and flagyl e-prescribed ------

## 2013-09-08 NOTE — Addendum Note (Signed)
Addended by: Catalina AntiguaONSTANT, PEGGY on: 09/08/2013 08:36 AM   Modules accepted: Orders

## 2013-09-09 LAB — CYTOLOGY - PAP

## 2014-03-31 ENCOUNTER — Emergency Department (HOSPITAL_COMMUNITY)
Admission: EM | Admit: 2014-03-31 | Discharge: 2014-03-31 | Disposition: A | Discharge status: Home / Self Care | Payer: Managed Care, Other (non HMO) | Attending: Emergency Medicine | Admitting: Emergency Medicine

## 2014-03-31 ENCOUNTER — Encounter (HOSPITAL_COMMUNITY): Payer: Self-pay | Admitting: Emergency Medicine

## 2014-03-31 DIAGNOSIS — Z8619 Personal history of other infectious and parasitic diseases: Secondary | ICD-10-CM | POA: Insufficient documentation

## 2014-03-31 DIAGNOSIS — Z720 Tobacco use: Secondary | ICD-10-CM | POA: Insufficient documentation

## 2014-03-31 DIAGNOSIS — N938 Other specified abnormal uterine and vaginal bleeding: Secondary | ICD-10-CM | POA: Insufficient documentation

## 2014-03-31 DIAGNOSIS — Z91040 Latex allergy status: Secondary | ICD-10-CM | POA: Insufficient documentation

## 2014-03-31 DIAGNOSIS — Z792 Long term (current) use of antibiotics: Secondary | ICD-10-CM | POA: Diagnosis not present

## 2014-03-31 DIAGNOSIS — N926 Irregular menstruation, unspecified: Secondary | ICD-10-CM | POA: Diagnosis not present

## 2014-03-31 DIAGNOSIS — Z79899 Other long term (current) drug therapy: Secondary | ICD-10-CM | POA: Diagnosis not present

## 2014-03-31 DIAGNOSIS — Z3202 Encounter for pregnancy test, result negative: Secondary | ICD-10-CM | POA: Diagnosis not present

## 2014-03-31 DIAGNOSIS — N939 Abnormal uterine and vaginal bleeding, unspecified: Secondary | ICD-10-CM | POA: Diagnosis present

## 2014-03-31 LAB — URINALYSIS, ROUTINE W REFLEX MICROSCOPIC
Bilirubin Urine: NEGATIVE
GLUCOSE, UA: NEGATIVE mg/dL
Ketones, ur: 15 mg/dL — AB
Nitrite: NEGATIVE
Protein, ur: NEGATIVE mg/dL
Specific Gravity, Urine: 1.029 (ref 1.005–1.030)
Urobilinogen, UA: 0.2 mg/dL (ref 0.0–1.0)
pH: 6 (ref 5.0–8.0)

## 2014-03-31 LAB — COMPREHENSIVE METABOLIC PANEL
ALBUMIN: 3.8 g/dL (ref 3.5–5.2)
ALK PHOS: 30 U/L — AB (ref 39–117)
ALT: 10 U/L (ref 0–35)
ANION GAP: 10 (ref 5–15)
AST: 23 U/L (ref 0–37)
BILIRUBIN TOTAL: 0.7 mg/dL (ref 0.3–1.2)
BUN: 7 mg/dL (ref 6–23)
CALCIUM: 8.5 mg/dL (ref 8.4–10.5)
CO2: 22 mmol/L (ref 19–32)
Chloride: 102 mEq/L (ref 96–112)
Creatinine, Ser: 0.71 mg/dL (ref 0.50–1.10)
GFR calc non Af Amer: >90 mL/min (ref >90)
GLUCOSE: 82 mg/dL (ref 70–99)
Potassium: 3.7 mmol/L (ref 3.5–5.1)
SODIUM: 134 mmol/L — AB (ref 135–145)
TOTAL PROTEIN: 7.1 g/dL (ref 6.0–8.3)

## 2014-03-31 LAB — POC URINE PREG, ED: PREG TEST UR: NEGATIVE

## 2014-03-31 LAB — URINE MICROSCOPIC-ADD ON

## 2014-03-31 LAB — CBC WITH DIFFERENTIAL/PLATELET
BASOS ABS: 0 10*3/uL (ref 0.0–0.1)
Basophils Relative: 0 % (ref 0–1)
EOS PCT: 3 % (ref 0–5)
Eosinophils Absolute: 0.1 10*3/uL (ref 0.0–0.7)
HCT: 35.6 % — ABNORMAL LOW (ref 36.0–46.0)
Hemoglobin: 11.9 g/dL — ABNORMAL LOW (ref 12.0–15.0)
LYMPHS ABS: 2.3 10*3/uL (ref 0.7–4.0)
Lymphocytes Relative: 49 % — ABNORMAL HIGH (ref 12–46)
MCH: 28.5 pg (ref 26.0–34.0)
MCHC: 33.4 g/dL (ref 30.0–36.0)
MCV: 85.2 fL (ref 78.0–100.0)
Monocytes Absolute: 0.4 10*3/uL (ref 0.1–1.0)
Monocytes Relative: 8 % (ref 3–12)
NEUTROS ABS: 1.8 10*3/uL (ref 1.7–7.7)
NEUTROS PCT: 40 % — AB (ref 43–77)
Platelets: 336 10*3/uL (ref 150–400)
RBC: 4.18 MIL/uL (ref 3.87–5.11)
RDW: 13.3 % (ref 11.5–15.5)
WBC: 4.7 10*3/uL (ref 4.0–10.5)

## 2014-03-31 MED ORDER — ESTROGENS CONJUGATED 1.25 MG PO TABS: 1.2500 mg | ORAL_TABLET | Freq: Every day | ORAL | Status: DC

## 2014-03-31 MED ORDER — NORGESTIMATE-ETH ESTRADIOL 0.25-35 MG-MCG PO TABS: 1.0000 | ORAL_TABLET | Freq: Every day | ORAL | Status: DC

## 2014-03-31 NOTE — ED Provider Notes (Signed)
CSN: [id number]     Arrival date & time 03/31/14  1433 History   First MD Initiated Contact with Patient 03/31/14 1738     Chief Complaint  Patient presents with  . Vaginal Bleeding      HPI  Patient presents for evaluation of vaginal bleeding. States this in high school she's had difficulty with irregular period.  She had equal on birth-control pills to "make me have a cycle". When off of them at age 24. 7 months ago and had a year of no ventral cycles and started on Sprintec has had regular menstrual cycle since then. Now for the last 3 weeks is a daily bleeding. This is despite continuing to take her regular non-placebo OCPs. No pain. She felt little weak today. Not syncopal or dizzy. No pain no discharge no other symptoms.  Past Medical History  Diagnosis Date  . Ovarian cyst   . Chicken pox   . Drug abuse     Alcohol Abuse  . Exposure to STD     Trichominosis  . Enlarged lymph node     Swelling   Past Surgical History  Procedure Laterality Date  . Hernia repair  2001  . Gastrostomy w/ feeding tube  1992    Difficulty swallowing&Digesting   Family History  Problem Relation Age of Onset  . Stroke Mother   . Hypertension Mother   . Diverticulosis Mother   . Mental illness Sister     bipolar  . Cancer Maternal Aunt     Breast/passed in 2011  . Arthritis Maternal Grandmother   . Cancer Maternal Grandmother     breast  . Hypertension Maternal Grandmother   . Diabetes Maternal Grandmother   . Alcohol abuse Father    History  Substance Use Topics  . Smoking status: Current Some Day Smoker  . Smokeless tobacco: Not on file  . Alcohol Use: 0.5 - 1.5 oz/week    1-3 drink(s) per week     Comment: Social   OB History    Gravida Para Term Preterm AB TAB SAB Ectopic Multiple Living   0              Review of Systems  Constitutional: Negative for fever, chills, diaphoresis, appetite change and fatigue.  HENT: Negative for mouth sores, sore throat and trouble  swallowing.   Eyes: Negative for visual disturbance.  Respiratory: Negative for cough, chest tightness, shortness of breath and wheezing.   Cardiovascular: Negative for chest pain.  Gastrointestinal: Negative for nausea, vomiting, abdominal pain, diarrhea and abdominal distention.  Endocrine: Negative for polydipsia, polyphagia and polyuria.  Genitourinary: Positive for vaginal bleeding and menstrual problem. Negative for dysuria, frequency and hematuria.  Musculoskeletal: Negative for gait problem.  Skin: Negative for color change, pallor and rash.  Neurological: Negative for dizziness, syncope, light-headedness and headaches.  Hematological: Does not bruise/bleed easily.  Psychiatric/Behavioral: Negative for behavioral problems and confusion.      Allergies  Latex  Home Medications   Prior to Admission medications   Medication Sig Start Date End Date Taking? Authorizing Provider  doxycycline (VIBRAMYCIN) 100 MG capsule Take 1 capsule (100 mg total) by mouth 2 (two) times daily. One po bid x 7 days 04/06/13   Loren Raceravid Yelverton, MD  fluconazole (DIFLUCAN) 150 MG tablet Take 1 tablet (150 mg total) by mouth once. 09/08/13   Peggy Constant, MD  ibuprofen (ADVIL,MOTRIN) 600 MG tablet Take 1 tablet (600 mg total) by mouth every 6 (six) hours as needed. 04/06/13  Loren Racer, MD  metroNIDAZOLE (FLAGYL) 500 MG tablet Take 1 tablet (500 mg total) by mouth 2 (two) times daily. 09/08/13   Peggy Constant, MD  norgestimate-ethinyl estradiol (ORTHO-CYCLEN,SPRINTEC,PREVIFEM) 0.25-35 MG-MCG tablet Take 1 tablet by mouth daily. 09/07/13   Peggy Constant, MD   BP 114/69 mmHg  Pulse 80  Temp(Src) 98.3 F (36.8 C) (Oral)  Resp 18  SpO2 99% Physical Exam  Constitutional: She is oriented to person, place, and time. She appears well-developed and well-nourished. No distress.  Awake and alert. Not orthostatic at the bedside. Conjunctiva not pale. Benign abdomen.  HENT:  Head: Normocephalic.  Eyes:  Conjunctivae are normal. Pupils are equal, round, and reactive to light. No scleral icterus.  Neck: Normal range of motion. Neck supple. No thyromegaly present.  Cardiovascular: Normal rate and regular rhythm.  Exam reveals no gallop and no friction rub.   No murmur heard. Pulmonary/Chest: Effort normal and breath sounds normal. No respiratory distress. She has no wheezes. She has no rales.  Abdominal: Soft. Bowel sounds are normal. She exhibits no distension. There is no tenderness. There is no rebound.  Musculoskeletal: Normal range of motion.  Neurological: She is alert and oriented to person, place, and time.  Skin: Skin is warm and dry. No rash noted.  Psychiatric: She has a normal mood and affect. Her behavior is normal.    ED Course  Procedures (including critical care time) Labs Review Labs Reviewed  CBC WITH DIFFERENTIAL - Abnormal; Notable for the following:    Hemoglobin 11.9 (*)    HCT 35.6 (*)    Neutrophils Relative % 40 (*)    Lymphocytes Relative 49 (*)    All other components within normal limits  COMPREHENSIVE METABOLIC PANEL - Abnormal; Notable for the following:    Sodium 134 (*)    Alkaline Phosphatase 30 (*)    All other components within normal limits  URINALYSIS, ROUTINE W REFLEX MICROSCOPIC - Abnormal; Notable for the following:    Color, Urine AMBER (*)    APPearance CLOUDY (*)    Hgb urine dipstick LARGE (*)    Ketones, ur 15 (*)    Leukocytes, UA TRACE (*)    All other components within normal limits  URINE MICROSCOPIC-ADD ON - Abnormal; Notable for the following:    Squamous Epithelial / LPF FEW (*)    Bacteria, UA FEW (*)    All other components within normal limits  HCG, SERUM, QUALITATIVE  POC URINE PREG, ED    Imaging Review No results found.   EKG Interpretation None      MDM   Final diagnoses:  Dysfunctional uterine bleeding   Not pregnant. Hemoglobin at her baseline at 11.2. I do not see she did pelvic exam. She is not having  discharge. She is not having pain. I think this very likely simple dysfunctional uterine bleeding. She's had difficulty with regularity throughout her adult life. On her current pills for the last 7 months. We'll have her stop her current OCPs. 5 days of estrogen which should hopefully stop her bleeding. Withdrawal bleeding/Mitchell cycle hopefully no more than several days. Resume OCPs. Follow-up with OB/GYN.    Rolland Porter, MD 03/31/14 [phone]

## 2014-03-31 NOTE — ED Notes (Signed)
Pt c/o vaginal bleeding x 1 month; pt sts some dizziness with heavy bleeding

## 2014-03-31 NOTE — Discharge Instructions (Signed)
Stop your birth control tablets. Take estrogen tablet 1 per day for the next 5 days. This should stop your bleeding. After finishing the estrogen he should expect an additional several days of menstrual bleeding. Aftercare menstrual cycle stops, resume your regular birth control. Follow-up with your OB/GYN.   Menorrhagia Menorrhagia is the medical term for when your menstrual periods are heavy or last longer than usual. With menorrhagia, every period you have may cause enough blood loss and cramping that you are unable to maintain your usual activities. CAUSES  In some cases, the cause of heavy periods is unknown, but a number of conditions may cause menorrhagia. Common causes include:  A problem with the hormone-producing thyroid gland (hypothyroid).  Noncancerous growths in the uterus (polyps or fibroids).  An imbalance of the estrogen and progesterone hormones.  One of your ovaries not releasing an egg during one or more months.  Side effects of having an intrauterine device (IUD).  Side effects of some medicines, such as anti-inflammatory medicines or blood thinners.  A bleeding disorder that stops your blood from clotting normally. SIGNS AND SYMPTOMS  During a normal period, bleeding lasts between 4 and 8 days. Signs that your periods are too heavy include:  You routinely have to change your pad or tampon every 1 or 2 hours because it is completely soaked.  You pass blood clots larger than 1 inch (2.5 cm) in size.  You have bleeding for more than 7 days.  You need to use pads and tampons at the same time because of heavy bleeding.  You need to wake up to change your pads or tampons during the night.  You have symptoms of anemia, such as tiredness, fatigue, or shortness of breath. DIAGNOSIS  Your health care provider will perform a physical exam and ask you questions about your symptoms and menstrual history. Other tests may be ordered based on what the health care  provider finds during the exam. These tests can include:  Blood tests. Blood tests are used to check if you are pregnant or have hormonal changes, a bleeding or thyroid disorder, low iron levels (anemia), or other problems.  Endometrial biopsy. Your health care provider takes a sample of tissue from the inside of your uterus to be examined under a microscope.  Pelvic ultrasound. This test uses sound waves to make a picture of your uterus, ovaries, and vagina. The pictures can show if you have fibroids or other growths.  Hysteroscopy. For this test, your health care provider will use a small telescope to look inside your uterus. Based on the results of your initial tests, your health care provider may recommend further testing. TREATMENT  Treatment may not be needed. If it is needed, your health care provider may recommend treatment with one or more medicines first. If these do not reduce bleeding enough, a surgical treatment might be an option. The best treatment for you will depend on:   Whether you need to prevent pregnancy.  Your desire to have children in the future.  The cause and severity of your bleeding.  Your opinion and personal preference.  Medicines for menorrhagia may include:  Birth control methods that use hormones. These include the pill, skin patch, vaginal ring, shots that you get every 3 months, hormonal IUD, and implant. These treatments reduce bleeding during your menstrual period.  Medicines that thicken blood and slow bleeding.  Medicines that reduce swelling, such as ibuprofen.  Medicines that contain a synthetic hormone called progestin.  Medicines that make the ovaries stop working for a short time.  You may need surgical treatment for menorrhagia if the medicines are unsuccessful. Treatment options include:  Dilation and curettage (D&C). In this procedure, your health care provider opens (dilates) your cervix and then scrapes or suctions tissue from  the lining of your uterus to reduce menstrual bleeding.  Operative hysteroscopy. This procedure uses a tiny tube with a light (hysteroscope) to view your uterine cavity and can help in the surgical removal of a polyp that may be causing heavy periods.  Endometrial ablation. Through various techniques, your health care provider permanently destroys the entire lining of your uterus (endometrium). After endometrial ablation, most women have little or no menstrual flow. Endometrial ablation reduces your ability to become pregnant.  Endometrial resection. This surgical procedure uses an electrosurgical wire loop to remove the lining of the uterus. This procedure also reduces your ability to become pregnant.  Hysterectomy. Surgical removal of the uterus and cervix is a permanent procedure that stops menstrual periods. Pregnancy is not possible after a hysterectomy. This procedure requires anesthesia and hospitalization. HOME CARE INSTRUCTIONS   Only take over-the-counter or prescription medicines as directed by your health care provider. Take prescribed medicines exactly as directed. Do not change or switch medicines without consulting your health care provider.  Take any prescribed iron pills exactly as directed by your health care provider. Long-term heavy bleeding may result in low iron levels. Iron pills help replace the iron your body lost from heavy bleeding. Iron may cause constipation. If this becomes a problem, increase the bran, fruits, and roughage in your diet.  Do not take aspirin or medicines that contain aspirin 1 week before or during your menstrual period. Aspirin may make the bleeding worse.  If you need to change your sanitary pad or tampon more than once every 2 hours, stay in bed and rest as much as possible until the bleeding stops.  Eat well-balanced meals. Eat foods high in iron. Examples are leafy green vegetables, meat, liver, eggs, and whole grain breads and cereals. Do not  try to lose weight until the abnormal bleeding has stopped and your blood iron level is back to normal. SEEK MEDICAL CARE IF:   You soak through a pad or tampon every 1 or 2 hours, and this happens every time you have a period.  You need to use pads and tampons at the same time because you are bleeding so much.  You need to change your pad or tampon during the night.  You have a period that lasts for more than 8 days.  You pass clots bigger than 1 inch wide.  You have irregular periods that happen more or less often than once a month.  You feel dizzy or faint.  You feel very weak or tired.  You feel short of breath or feel your heart is beating too fast when you exercise.  You have nausea and vomiting or diarrhea while you are taking your medicine.  You have any problems that may be related to the medicine you are taking. SEEK IMMEDIATE MEDICAL CARE IF:   You soak through 4 or more pads or tampons in 2 hours.  You have any bleeding while you are pregnant. MAKE SURE YOU:   Understand these instructions.  Will watch your condition.  Will get help right away if you are not doing well or get worse. Document Released: 03/10/2005 Document Revised: 03/15/2013 Document Reviewed: 08/29/2012 [facility] Patient Information 2015 [place], [place].  This information is not intended to replace advice given to you by your health care provider. Make sure you discuss any questions you have with your health care provider. ° °

## 2014-05-03 ENCOUNTER — Ambulatory Visit: Payer: Managed Care, Other (non HMO) | Admitting: Obstetrics and Gynecology

## 2014-06-01 ENCOUNTER — Encounter (HOSPITAL_COMMUNITY): Payer: Self-pay | Admitting: Emergency Medicine

## 2014-06-01 ENCOUNTER — Emergency Department (HOSPITAL_COMMUNITY)
Admission: EM | Admit: 2014-06-01 | Discharge: 2014-06-01 | Disposition: A | Discharge status: Home / Self Care | Payer: Managed Care, Other (non HMO) | Attending: Emergency Medicine | Admitting: Emergency Medicine

## 2014-06-01 DIAGNOSIS — Z91040 Latex allergy status: Secondary | ICD-10-CM | POA: Diagnosis not present

## 2014-06-01 DIAGNOSIS — R05 Cough: Secondary | ICD-10-CM | POA: Insufficient documentation

## 2014-06-01 DIAGNOSIS — Z793 Long term (current) use of hormonal contraceptives: Secondary | ICD-10-CM | POA: Insufficient documentation

## 2014-06-01 DIAGNOSIS — Z720 Tobacco use: Secondary | ICD-10-CM | POA: Insufficient documentation

## 2014-06-01 DIAGNOSIS — H9201 Otalgia, right ear: Secondary | ICD-10-CM | POA: Diagnosis not present

## 2014-06-01 DIAGNOSIS — Z79899 Other long term (current) drug therapy: Secondary | ICD-10-CM | POA: Insufficient documentation

## 2014-06-01 DIAGNOSIS — J3489 Other specified disorders of nose and nasal sinuses: Secondary | ICD-10-CM | POA: Diagnosis not present

## 2014-06-01 DIAGNOSIS — Z8619 Personal history of other infectious and parasitic diseases: Secondary | ICD-10-CM | POA: Diagnosis not present

## 2014-06-01 DIAGNOSIS — Z8742 Personal history of other diseases of the female genital tract: Secondary | ICD-10-CM | POA: Insufficient documentation

## 2014-06-01 DIAGNOSIS — R0981 Nasal congestion: Secondary | ICD-10-CM | POA: Diagnosis present

## 2014-06-01 MED ORDER — AMOXICILLIN 500 MG PO CAPS: 500.0000 mg | ORAL_CAPSULE | Freq: Three times a day (TID) | ORAL | Status: DC

## 2014-06-01 NOTE — ED Provider Notes (Signed)
CSN: [id number]     Arrival date & time 06/01/14  [id number] History   First MD Initiated Contact with Patient 06/01/14 0756     Chief Complaint  Patient presents with  . Nasal Congestion  . Otalgia     (Consider location/radiation/quality/duration/timing/severity/associated sxs/prior Treatment) Patient is a 24 y.o. female presenting with ear pain. The history is provided by the patient. No language interpreter was used.  Otalgia Location:  Right Quality:  Aching Severity:  Moderate Duration:  3 days Timing:  Constant Progression:  Worsening Chronicity:  New Relieved by:  Nothing Worsened by:  Nothing tried Ineffective treatments:  None tried Associated symptoms: congestion, cough, rhinorrhea and sore throat     Past Medical History  Diagnosis Date  . Ovarian cyst   . Chicken pox   . Drug abuse     Alcohol Abuse  . Exposure to STD     Trichominosis  . Enlarged lymph node     Swelling   Past Surgical History  Procedure Laterality Date  . Hernia repair  2001  . Gastrostomy w/ feeding tube  1992    Difficulty swallowing&Digesting   Family History  Problem Relation Age of Onset  . Stroke Mother   . Hypertension Mother   . Diverticulosis Mother   . Mental illness Sister     bipolar  . Cancer Maternal Aunt     Breast/passed in 2011  . Arthritis Maternal Grandmother   . Cancer Maternal Grandmother     breast  . Hypertension Maternal Grandmother   . Diabetes Maternal Grandmother   . Alcohol abuse Father    History  Substance Use Topics  . Smoking status: Current Some Day Smoker  . Smokeless tobacco: Not on file  . Alcohol Use: 0.5 - 1.5 oz/week    1-3 drink(s) per week     Comment: Social   OB History    Gravida Para Term Preterm AB TAB SAB Ectopic Multiple Living   0              Review of Systems  HENT: Positive for congestion, ear pain, rhinorrhea and sore throat.   Respiratory: Positive for cough.   All other systems reviewed and are  negative.     Allergies  Latex  Home Medications   Prior to Admission medications   Medication Sig Start Date End Date Taking? Authorizing Provider  amoxicillin (AMOXIL) 500 MG capsule Take 1 capsule (500 mg total) by mouth 3 (three) times daily. 06/01/14   Elson AreasLeslie K Leesha Veno, PA-C  doxycycline (VIBRAMYCIN) 100 MG capsule Take 1 capsule (100 mg total) by mouth 2 (two) times daily. One po bid x 7 days 04/06/13   Loren Raceravid Yelverton, MD  estrogens, conjugated, (PREMARIN) 1.25 MG tablet Take 1 tablet (1.25 mg total) by mouth daily. 03/31/14 04/05/14  Rolland PorterMark James, MD  fluconazole (DIFLUCAN) 150 MG tablet Take 1 tablet (150 mg total) by mouth once. 09/08/13   Peggy Constant, MD  ibuprofen (ADVIL,MOTRIN) 600 MG tablet Take 1 tablet (600 mg total) by mouth every 6 (six) hours as needed. 04/06/13   Loren Raceravid Yelverton, MD  metroNIDAZOLE (FLAGYL) 500 MG tablet Take 1 tablet (500 mg total) by mouth 2 (two) times daily. 09/08/13   Peggy Constant, MD  norgestimate-ethinyl estradiol (ORTHO-CYCLEN,SPRINTEC,PREVIFEM) 0.25-35 MG-MCG tablet Take 1 tablet by mouth daily. 09/07/13   Peggy Constant, MD  norgestimate-ethinyl estradiol (SPRINTEC 28) 0.25-35 MG-MCG tablet Take 1 tablet by mouth daily. 03/31/14   Rolland PorterMark James, MD   BP 123/85  mmHg  Pulse 99  Temp(Src) 98.7 F (37.1 C) (Oral)  Resp 18  SpO2 100% Physical Exam  Constitutional: She is oriented to person, place, and time. She appears well-developed and well-nourished.  HENT:  Head: Normocephalic.  Nose: Nose normal.  bilat tm's erythematous  Eyes: EOM are normal. Pupils are equal, round, and reactive to light.  Neck: Normal range of motion.  Cardiovascular: Normal heart sounds.   Pulmonary/Chest: Effort normal.  Abdominal: She exhibits no distension.  Musculoskeletal: Normal range of motion.  Neurological: She is alert and oriented to person, place, and time.  Skin: Skin is warm.  Psychiatric: She has a normal mood and affect.  Nursing note and vitals  reviewed.   ED Course  Procedures (including critical care time) Labs Review Labs Reviewed - No data to display  Imaging Review No results found.   EKG Interpretation None      MDM   Final diagnoses:  Sinus pain    amoxicillian  Return if any problems.  Lonia Skinner [place], PA-C 06/01/14 4540  Toy Cookey, MD 06/01/14 2104

## 2014-06-01 NOTE — Discharge Instructions (Signed)
Sinusitis  Sinusitis is redness, soreness, and inflammation of the paranasal sinuses. Paranasal sinuses are air pockets within the bones of your face (beneath the eyes, the middle of the forehead, or above the eyes). In healthy paranasal sinuses, mucus is able to drain out, and air is able to circulate through them by way of your nose. However, when your paranasal sinuses are inflamed, mucus and air can become trapped. This can allow bacteria and other germs to grow and cause infection.  Sinusitis can develop quickly and last only a short time (acute) or continue over a long period (chronic). Sinusitis that lasts for more than 12 weeks is considered chronic.   CAUSES   Causes of sinusitis include:   Allergies.   Structural abnormalities, such as displacement of the cartilage that separates your nostrils (deviated septum), which can decrease the air flow through your nose and sinuses and affect sinus drainage.   Functional abnormalities, such as when the small hairs (cilia) that line your sinuses and help remove mucus do not work properly or are not present.  SIGNS AND SYMPTOMS   Symptoms of acute and chronic sinusitis are the same. The primary symptoms are pain and pressure around the affected sinuses. Other symptoms include:   Upper toothache.   Earache.   Headache.   Bad breath.   Decreased sense of smell and taste.   A cough, which worsens when you are lying flat.   Fatigue.   Fever.   Thick drainage from your nose, which often is green and may contain pus (purulent).   Swelling and warmth over the affected sinuses.  DIAGNOSIS   Your health care provider will perform a physical exam. During the exam, your health care provider may:   Look in your nose for signs of abnormal growths in your nostrils (nasal polyps).   Tap over the affected sinus to check for signs of infection.   View the inside of your sinuses (endoscopy) using an imaging device that has a light attached (endoscope).  If your health  care provider suspects that you have chronic sinusitis, one or more of the following tests may be recommended:   Allergy tests.   Nasal culture. A sample of mucus is taken from your nose, sent to a lab, and screened for bacteria.   Nasal cytology. A sample of mucus is taken from your nose and examined by your health care provider to determine if your sinusitis is related to an allergy.  TREATMENT   Most cases of acute sinusitis are related to a viral infection and will resolve on their own within 10 days. Sometimes medicines are prescribed to help relieve symptoms (pain medicine, decongestants, nasal steroid sprays, or saline sprays).   However, for sinusitis related to a bacterial infection, your health care provider will prescribe antibiotic medicines. These are medicines that will help kill the bacteria causing the infection.   Rarely, sinusitis is caused by a fungal infection. In theses cases, your health care provider will prescribe antifungal medicine.  For some cases of chronic sinusitis, surgery is needed. Generally, these are cases in which sinusitis recurs more than 3 times per year, despite other treatments.  HOME CARE INSTRUCTIONS    Drink plenty of water. Water helps thin the mucus so your sinuses can drain more easily.   Use a humidifier.   Inhale steam 3 to 4 times a day (for example, sit in the bathroom with the shower running).   Apply a warm, moist washcloth to your face   3 to 4 times a day, or as directed by your health care provider.   Use saline nasal sprays to help moisten and clean your sinuses.   Take medicines only as directed by your health care provider.   If you were prescribed either an antibiotic or antifungal medicine, finish it all even if you start to feel better.  SEEK IMMEDIATE MEDICAL CARE IF:   You have increasing pain or severe headaches.   You have nausea, vomiting, or drowsiness.   You have swelling around your face.   You have vision problems.   You have a stiff  neck.   You have difficulty breathing.  MAKE SURE YOU:    Understand these instructions.   Will watch your condition.   Will get help right away if you are not doing well or get worse.  Document Released: 03/10/2005 Document Revised: 07/25/2013 Document Reviewed: 03/25/2011  ExitCare Patient Information 2015 [employer]. This information is not intended to replace advice given to you by your health care provider. Make sure you discuss any questions you have with your health care provider.  Otitis Media  Otitis media is redness, soreness, and inflammation of the middle ear. Otitis media may be caused by allergies or, most commonly, by infection. Often it occurs as a complication of the common cold.  SIGNS AND SYMPTOMS  Symptoms of otitis media may include:   Earache.   Fever.   Ringing in your ear.   Headache.   Leakage of fluid from the ear.  DIAGNOSIS  To diagnose otitis media, your health care provider will examine your ear with an otoscope. This is an instrument that allows your health care provider to see into your ear in order to examine your eardrum. Your health care provider also will ask you questions about your symptoms.  TREATMENT   Typically, otitis media resolves on its own within 3-5 days. Your health care provider may prescribe medicine to ease your symptoms of pain. If otitis media does not resolve within 5 days or is recurrent, your health care provider may prescribe antibiotic medicines if he or she suspects that a bacterial infection is the cause.  HOME CARE INSTRUCTIONS    If you were prescribed an antibiotic medicine, finish it all even if you start to feel better.   Take medicines only as directed by your health care provider.   Keep all follow-up visits as directed by your health care provider.  SEEK MEDICAL CARE IF:   You have otitis media only in one ear, or bleeding from your nose, or both.   You notice a lump on your neck.   You are not getting better in 3-5 days.   You  feel worse instead of better.  SEEK IMMEDIATE MEDICAL CARE IF:    You have pain that is not controlled with medicine.   You have swelling, redness, or pain around your ear or stiffness in your neck.   You notice that part of your face is paralyzed.   You notice that the bone behind your ear (mastoid) is tender when you touch it.  MAKE SURE YOU:    Understand these instructions.   Will watch your condition.   Will get help right away if you are not doing well or get worse.  Document Released: 12/14/2003 Document Revised: 07/25/2013 Document Reviewed: 10/05/2012  Exit[employer] Patient Information 2015 [employer]. This information is not intended to replace advice given to you by your health care provider. Make sure you   discuss any questions you have with your health care provider.

## 2014-06-01 NOTE — ED Notes (Signed)
Patient states nasal congestion and R ear pain x 3 days.   Patient states has been using nyquil at home to control symptoms.

## 2014-06-05 ENCOUNTER — Ambulatory Visit: Payer: Managed Care, Other (non HMO) | Admitting: Obstetrics & Gynecology

## 2015-03-05 ENCOUNTER — Ambulatory Visit: Payer: Managed Care, Other (non HMO) | Admitting: Obstetrics and Gynecology

## 2015-03-10 ENCOUNTER — Encounter (HOSPITAL_COMMUNITY): Payer: Self-pay | Admitting: Emergency Medicine

## 2015-03-10 ENCOUNTER — Emergency Department (HOSPITAL_COMMUNITY)
Admission: EM | Admit: 2015-03-10 | Discharge: 2015-03-10 | Disposition: A | Discharge status: Home / Self Care | Payer: Managed Care, Other (non HMO) | Attending: Emergency Medicine | Admitting: Emergency Medicine

## 2015-03-10 DIAGNOSIS — K029 Dental caries, unspecified: Secondary | ICD-10-CM | POA: Diagnosis not present

## 2015-03-10 DIAGNOSIS — H9209 Otalgia, unspecified ear: Secondary | ICD-10-CM | POA: Diagnosis not present

## 2015-03-10 DIAGNOSIS — K002 Abnormalities of size and form of teeth: Secondary | ICD-10-CM | POA: Diagnosis not present

## 2015-03-10 DIAGNOSIS — Z91040 Latex allergy status: Secondary | ICD-10-CM | POA: Diagnosis not present

## 2015-03-10 DIAGNOSIS — K0889 Other specified disorders of teeth and supporting structures: Secondary | ICD-10-CM | POA: Diagnosis present

## 2015-03-10 DIAGNOSIS — Z8742 Personal history of other diseases of the female genital tract: Secondary | ICD-10-CM | POA: Insufficient documentation

## 2015-03-10 DIAGNOSIS — F17210 Nicotine dependence, cigarettes, uncomplicated: Secondary | ICD-10-CM | POA: Diagnosis not present

## 2015-03-10 DIAGNOSIS — Z8619 Personal history of other infectious and parasitic diseases: Secondary | ICD-10-CM | POA: Insufficient documentation

## 2015-03-10 MED ORDER — TRAMADOL HCL 50 MG PO TABS: 50.0000 mg | ORAL_TABLET | Freq: Four times a day (QID) | ORAL | Status: AC | PRN

## 2015-03-10 MED ORDER — PENICILLIN V POTASSIUM 500 MG PO TABS
500.0000 mg | ORAL_TABLET | Freq: Three times a day (TID) | ORAL | Status: AC
Start: 2015-03-10 — End: ?

## 2015-03-10 MED ORDER — NAPROXEN 500 MG PO TABS: 500.0000 mg | ORAL_TABLET | Freq: Two times a day (BID) | ORAL | Status: AC

## 2015-03-10 MED ORDER — NAPROXEN 250 MG PO TABS
500.0000 mg | ORAL_TABLET | Freq: Once | ORAL | Status: AC
  Administered 2015-03-10: 500 mg via ORAL
  Filled 2015-03-10: qty 2

## 2015-03-10 NOTE — ED Provider Notes (Signed)
CSN: [id number]     Arrival date & time 03/10/15  0533 History   First MD Initiated Contact with Patient 03/10/15 [phone]     Chief Complaint  Patient presents with  . Dental Pain     (Consider location/radiation/quality/duration/timing/severity/associated sxs/prior Treatment) HPI Comments: Patient presents with complaint of gradual onset, gradually worsening left upper dental pain for the past 2 days. Patient used Orajel yesterday with some relief however overnight became much worse. Pain radiates to the left ear. No facial swelling. No fever, difficulty breathing or swallowing. Patient thought that she was having pain from her wisdom tooth.  Patient is a 24 y.o. female presenting with tooth pain. The history is provided by the patient.  Dental Pain Associated symptoms: no facial swelling, no fever, no headaches and no neck pain     Past Medical History  Diagnosis Date  . Ovarian cyst   . Chicken pox   . Drug abuse     Alcohol Abuse  . Exposure to STD     Trichominosis  . Enlarged lymph node     Swelling   Past Surgical History  Procedure Laterality Date  . Hernia repair  2001  . Gastrostomy w/ feeding tube  1992    Difficulty swallowing&Digesting   Family History  Problem Relation Age of Onset  . Stroke Mother   . Hypertension Mother   . Diverticulosis Mother   . Mental illness Sister     bipolar  . Cancer Maternal Aunt     Breast/passed in 2011  . Arthritis Maternal Grandmother   . Cancer Maternal Grandmother     breast  . Hypertension Maternal Grandmother   . Diabetes Maternal Grandmother   . Alcohol abuse Father    Social History  Substance Use Topics  . Smoking status: Current Some Day Smoker    Types: Cigarettes  . Smokeless tobacco: None  . Alcohol Use: Yes   OB History    Gravida Para Term Preterm AB TAB SAB Ectopic Multiple Living   0              Review of Systems  Constitutional: Negative for fever.  HENT: Positive for dental problem and ear  pain. Negative for facial swelling, sore throat and trouble swallowing.   Respiratory: Negative for shortness of breath and stridor.   Musculoskeletal: Negative for neck pain.  Skin: Negative for color change.  Neurological: Negative for headaches.      Allergies  Latex  Home Medications   Prior to Admission medications   Medication Sig Start Date End Date Taking? Authorizing Provider  benzocaine (ORAJEL) 10 % mucosal gel Use as directed 1 application in the mouth or throat as needed for mouth pain.   Yes Historical Provider, MD  ibuprofen (ADVIL,MOTRIN) 200 MG tablet Take 200 mg by mouth every 6 (six) hours as needed for mild pain.   Yes Historical Provider, MD  naproxen (NAPROSYN) 500 MG tablet Take 1 tablet (500 mg total) by mouth 2 (two) times daily. 03/10/15   Renne CriglerJoshua Gerrad Welker, PA-C  penicillin v potassium (VEETID) 500 MG tablet Take 1 tablet (500 mg total) by mouth 3 (three) times daily. 03/10/15   Renne CriglerJoshua Myrtle Haller, PA-C  traMADol (ULTRAM) 50 MG tablet Take 1 tablet (50 mg total) by mouth every 6 (six) hours as needed. 03/10/15   Renne CriglerJoshua Marcelus Dubberly, PA-C   BP 123/82 mmHg  Pulse 86  Temp(Src) 97.6 F (36.4 C) (Oral)  Resp 20  Ht 5\' 5"  (1.651 m)  Wt  57.805 kg  BMI 21.21 kg/m2  SpO2 99%  LMP 02/21/2015 (Approximate)   Physical Exam  Constitutional: She appears well-developed and well-nourished.  HENT:  Head: Normocephalic and atraumatic.  Right Ear: Tympanic membrane, external ear and ear canal normal.  Left Ear: Tympanic membrane, external ear and ear canal normal.  Nose: Nose normal.  Mouth/Throat: Uvula is midline, oropharynx is clear and moist and mucous membranes are normal. No trismus in the jaw. Abnormal dentition. Dental caries present. No dental abscesses or uvula swelling. No tonsillar abscesses.  Patient with L maxillary tooth pain and tenderness to palpation in area of tooth #15 and 16. Mild swelling and erythema noted on exam.  Eyes: Conjunctivae are normal.  Neck:  Normal range of motion. Neck supple.  No neck swelling or Ludwig's angina  Lymphadenopathy:    She has no cervical adenopathy.  Neurological: She is alert.  Skin: Skin is warm and dry.  Psychiatric: She has a normal mood and affect.  Nursing note and vitals reviewed.   ED Course  Procedures (including critical care time) Labs Review Labs Reviewed - No data to display  Imaging Review No results found. I have personally reviewed and evaluated these images and lab results as part of my medical decision-making.   EKG Interpretation None       7:32 AM Patient seen and examined.   Vital signs reviewed and are as follows: BP 123/82 mmHg  Pulse 86  Temp(Src) 97.6 F (36.4 C) (Oral)  Resp 20  Ht  (1.651 m)  Wt 57.805 kg  BMI 21.21 kg/m2  SpO2 99%  LMP 02/21/2015 (Approximate)  Patient counseled on use of narcotic pain medications. Counseled not to combine these medications with others containing tylenol. Urged not to drink alcohol, drive, or perform any other activities that requires focus while taking these medications. The patient verbalizes understanding and agrees with the plan.  Patient counseled to take prescribed medications as directed, return with worsening facial or neck swelling, and to follow-up with their dentist as soon as possible.     MDM   Final diagnoses:  Toothache   Patient with toothache. No fever. Exam unconcerning for Ludwig's angina or other deep tissue infection in neck.   Will treat with antibiotic and pain medicine. Urged patient to follow-up with dentist.       Renne Crigler, PA-C 03/10/15 1610  Azalia Bilis, MD 03/10/15 [phone]

## 2015-03-10 NOTE — Discharge Instructions (Signed)
Please read and follow all provided instructions.  Your diagnoses today include:  1. Toothache     The exam and treatment you received today has been provided on an emergency basis only. This is not a substitute for complete medical or dental care.  Tests performed today include:  Vital signs. See below for your results today.   Medications prescribed:   Tramadol - narcotic-like pain medication  DO NOT drive or perform any activities that require you to be awake and alert because this medicine can make you drowsy.    Naproxen - anti-inflammatory pain medication  Do not exceed  naproxen every 12 hours, take with food  You have been prescribed an anti-inflammatory medication or NSAID. Take with food. Take smallest effective dose for the shortest duration needed for your pain. Stop taking if you experience stomach pain or vomiting.    Penicillin - antibiotic  You have been prescribed an antibiotic medicine: take the entire course of medicine even if you are feeling better. Stopping early can cause the antibiotic not to work.  Take any prescribed medications only as directed.  Home care instructions:  Follow any educational materials contained in this packet.  Follow-up instructions: Please follow-up with your dentist for further evaluation of your symptoms.   Dental Assistance: See below for dental referrals  Return instructions:   Please return to the Emergency Department if you experience worsening symptoms.  Please return if you develop a fever, you develop more swelling in your face or neck, you have trouble breathing or swallowing food.  Please return if you have any other emergent concerns.  Additional Information:  Your vital signs today were: BP 123/82 mmHg   Pulse 86   Temp(Src) 97.6 F (36.4 C) (Oral)   Resp 20   Ht  (1.651 m)   Wt 57.805 kg   BMI 21.21 kg/m2   SpO2 99%   LMP 02/21/2015 (Approximate) If your blood pressure (BP) was elevated above  135/85 this visit, please have this repeated by your doctor within one month. -------------- Dental Care: Organization         Address  Phone  Notes  [facility] Department of [facility] [address] [place], [place] [phone] Accepts children up to age 55 who are enrolled in [place] or [place] Health Choice; pregnant women with a Medicaid card; and children who have applied for Medicaid or [place] Health Choice, but were declined, whose parents can pay a reduced fee at time of service.  [facility] Department of [facility]  [address], [place] [phone] Accepts children up to age 1 who are enrolled in [place] or [place] Health Choice; pregnant women with a Medicaid card; and children who have applied for Medicaid or [place] Health Choice, but were declined, whose parents can pay a reduced fee at time of service.  [facility]  [address] [place], [place] [phone] Patients are seen by appointment only. Walk-ins are not accepted. [facility] will see patients 72 years of age and older. Monday - Tuesday (8am-5pm) Most Wednesdays (8:30-5pm) $30 per visit, cash only  [facility]  [address], [facility] [phone] Patients are seen by appointment only. Walk-ins are not accepted. [facility] will see patients 64 years of age and older. One Wednesday Evening (Monthly: Volunteer Based).  $30 per visit, cash only  [employer]  [phone]  for adults; Children under age 304, call Graduate Pediatric Dentistry at [phone]. Children aged 694-14, please call [phone] to request a pediatric application.  Dental services are provided in all areas of dental care including fillings, crowns and bridges, complete and partial dentures, implants, gum treatment, root canals, and extractions. Preventive care is also provided. Treatment is provided to both  adults and children. Patients are selected via a lottery and there is often a waiting list.   [facility] [address], [place]  [phone] [email or web address]   [facility] [address], [place], [place] [phone], Ext. [phone] Second and Fourth Thursday of each month, opens at 6:30 AM; Clinic ends at 9 AM.  Patients are seen on a first-come first-served basis, and a limited number are seen during each clinic.   [facility]  [address], [place], [place] [phone]   Eligibility Requirements You must have lived in [place], [place], or [place] counties for at least the last three months.   You cannot be eligible for state or federal sponsored [place] insurance, including [employer], [place], or [employer]are.   You generally cannot be eligible for healthcare insurance through your employer.    How to apply: Eligibility screenings are held every Tuesday and Wednesday afternoon from 1:00 pm until 4:00 pm. You do not need an appointment for the interview!  [facility] [address], [place], [place] [phone]   [facility]  [phone]   [facility]  [phone]   [facility]  [phone]

## 2015-03-10 NOTE — ED Notes (Signed)
Pt. reports worsening left upper and lower molar pain for 2 days unrelieved by OTC pain medications .
